# Patient Record
Sex: Female | Born: 1985
Health system: Southern US, Community
[De-identification: ages and names within clinical notes are randomized; demographics above are authoritative.]

## PROBLEM LIST (undated history)

## (undated) DIAGNOSIS — K219 Gastro-esophageal reflux disease without esophagitis: Secondary | ICD-10-CM

## (undated) DIAGNOSIS — F419 Anxiety disorder, unspecified: Secondary | ICD-10-CM

## (undated) HISTORY — DX: Anxiety disorder, unspecified: F41.9

## (undated) HISTORY — DX: Gastro-esophageal reflux disease without esophagitis: K21.9

## (undated) HISTORY — PX: DILATION AND CURETTAGE, DIAGNOSTIC / THERAPEUTIC: SUR384

---

## 2013-03-23 ENCOUNTER — Telehealth: Payer: Self-pay | Admitting: Nurse Practitioner

## 2013-03-23 NOTE — Telephone Encounter (Signed)
Spoke with patient's mother. She complains nausea, diarrhea, fever up to 101, and bodyaches. Last episode of vomiting was 11 hrs ago and she has tolerated liquids today. Advised her that she should treat the symptoms. Advil and Tylenol for fever. Once fever is down bodyaches should improve. Clear liquids as tolerated. No caffeine or dairy products. Do not use any Imodium at this time but if diarrhea persists >24 hrs she should follow-up with our office. Made her aware of Saturday clinic hours and that there is someone available by phone when the office is closed. Seek care at ED or urgent care if necessary when the office is closed.  Mother stated understanding and agreement to plan.

## 2013-06-18 ENCOUNTER — Ambulatory Visit (INDEPENDENT_AMBULATORY_CARE_PROVIDER_SITE_OTHER): Payer: Managed Care, Other (non HMO) | Admitting: General Practice

## 2013-06-18 ENCOUNTER — Encounter: Payer: Self-pay | Admitting: General Practice

## 2013-06-18 ENCOUNTER — Encounter (INDEPENDENT_AMBULATORY_CARE_PROVIDER_SITE_OTHER): Payer: Self-pay

## 2013-06-18 VITALS — BP 112/66 | HR 70 | Temp 98.5°F | Ht 66.0 in | Wt 157.2 lb

## 2013-06-18 DIAGNOSIS — F411 Generalized anxiety disorder: Secondary | ICD-10-CM

## 2013-06-18 MED ORDER — VENLAFAXINE HCL 37.5 MG PO TABS: 75.0000 mg | ORAL_TABLET | Freq: Every day | ORAL | Status: DC

## 2013-06-18 NOTE — Patient Instructions (Signed)

## 2013-06-18 NOTE — Progress Notes (Signed)
   Subjective:    Patient ID: Denise Love, female    DOB: 1985/08/22, 28 y.o.   MRN: [id number]  HPI Patient present today for follow up of anxiety. Taking effexor, 2 tablets daily. Anxiety well controlled with medication.     Review of Systems  Constitutional: Negative for fever and chills.  Respiratory: Negative for chest tightness and shortness of breath.   Cardiovascular: Negative for chest pain and palpitations.  Psychiatric/Behavioral: Negative for suicidal ideas and self-injury. The patient is not nervous/anxious.        Objective:   Physical Exam  Constitutional: She is oriented to person, place, and time. She appears well-developed and well-nourished.  Cardiovascular: Normal rate, regular rhythm and normal heart sounds.   Pulmonary/Chest: Effort normal and breath sounds normal. No respiratory distress. She exhibits no tenderness.  Abdominal: Soft. Bowel sounds are normal. She exhibits no distension. There is no tenderness.  Neurological: She is alert and oriented to person, place, and time.  Skin: Skin is warm and dry.  Psychiatric: She has a normal mood and affect.          Assessment & Plan:  1. GAD (generalized anxiety disorder) - venlafaxine (EFFEXOR) 37.5 MG tablet; Take 2 tablets (75 mg total) by mouth daily.  Dispense: 180 tablet; Refill: 1 -discussed relaxation techniques -RTO prn and schedule appointment Patient verbalized understanding Coralie KeensMae E. Drue Camera, FNP-C

## 2013-12-10 ENCOUNTER — Other Ambulatory Visit: Payer: Self-pay | Admitting: General Practice

## 2013-12-11 NOTE — Telephone Encounter (Signed)
Last ov 3/15 with Mae. ntbs

## 2013-12-11 NOTE — Telephone Encounter (Signed)
no more refills without being seen  

## 2014-01-09 ENCOUNTER — Other Ambulatory Visit: Payer: Self-pay | Admitting: Nurse Practitioner

## 2014-01-10 ENCOUNTER — Other Ambulatory Visit: Payer: Self-pay | Admitting: Nurse Practitioner

## 2014-01-10 ENCOUNTER — Telehealth: Payer: Self-pay | Admitting: Nurse Practitioner

## 2014-01-10 MED ORDER — VENLAFAXINE HCL ER 37.5 MG PO CP24: ORAL_CAPSULE | ORAL | Status: DC

## 2014-01-10 NOTE — Telephone Encounter (Signed)
Done -per mmm

## 2014-01-18 ENCOUNTER — Ambulatory Visit: Payer: Managed Care, Other (non HMO) | Admitting: Nurse Practitioner

## 2014-01-25 ENCOUNTER — Encounter (INDEPENDENT_AMBULATORY_CARE_PROVIDER_SITE_OTHER): Payer: Self-pay

## 2014-01-25 ENCOUNTER — Ambulatory Visit (INDEPENDENT_AMBULATORY_CARE_PROVIDER_SITE_OTHER): Payer: Managed Care, Other (non HMO) | Admitting: Nurse Practitioner

## 2014-01-25 ENCOUNTER — Encounter: Payer: Self-pay | Admitting: Nurse Practitioner

## 2014-01-25 VITALS — BP 116/69 | HR 76 | Temp 99.7°F | Ht 66.0 in | Wt 173.4 lb

## 2014-01-25 DIAGNOSIS — F411 Generalized anxiety disorder: Secondary | ICD-10-CM

## 2014-01-25 MED ORDER — VENLAFAXINE HCL ER 37.5 MG PO CP24: ORAL_CAPSULE | ORAL | Status: DC

## 2014-01-25 NOTE — Patient Instructions (Signed)
Stress and Stress Management Stress is a normal reaction to life events. It is what you feel when life demands more than you are used to or more than you can handle. Some stress can be useful. For example, the stress reaction can help you catch the last bus of the day, study for a test, or meet a deadline at work. But stress that occurs too often or for too long can cause problems. It can affect your emotional health and interfere with relationships and normal daily activities. Too much stress can weaken your immune system and increase your risk for physical illness. If you already have a medical problem, stress can make it worse. CAUSES  All sorts of life events may cause stress. An event that causes stress for one person may not be stressful for another person. Major life events commonly cause stress. These may be positive or negative. Examples include losing your job, moving into a new home, getting married, having a baby, or losing a loved one. Less obvious life events may also cause stress, especially if they occur day after day or in combination. Examples include working long hours, driving in traffic, caring for children, being in debt, or being in a difficult relationship. SIGNS AND SYMPTOMS Stress may cause emotional symptoms including, the following:  Anxiety. This is feeling worried, afraid, on edge, overwhelmed, or out of control.  Anger. This is feeling irritated or impatient.  Depression. This is feeling sad, down, helpless, or guilty.  Difficulty focusing, remembering, or making decisions. Stress may cause physical symptoms, including the following:   Aches and pains. These may affect your head, neck, back, stomach, or other areas of your body.  Tight muscles or clenched jaw.  Low energy or trouble sleeping. Stress may cause unhealthy behaviors, including the following:   Eating to feel better (overeating) or skipping meals.  Sleeping too little, too much, or both.  Working  too much or putting off tasks (procrastination).  Smoking, drinking alcohol, or using drugs to feel better. DIAGNOSIS  Stress is diagnosed through an assessment by your health care provider. Your health care provider will ask questions about your symptoms and any stressful life events.Your health care provider will also ask about your medical history and may order blood tests or other tests. Certain medical conditions and medicine can cause physical symptoms similar to stress. Mental illness can cause emotional symptoms and unhealthy behaviors similar to stress. Your health care provider may refer you to a mental health professional for further evaluation.  TREATMENT  Stress management is the recommended treatment for stress.The goals of stress management are reducing stressful life events and coping with stress in healthy ways.  Techniques for reducing stressful life events include the following:  Stress identification. Self-monitor for stress and identify what causes stress for you. These skills may help you to avoid some stressful events.  Time management. Set your priorities, keep a calendar of events, and learn to say "no." These tools can help you avoid making too many commitments. Techniques for coping with stress include the following:  Rethinking the problem. Try to think realistically about stressful events rather than ignoring them or overreacting. Try to find the positives in a stressful situation rather than focusing on the negatives.  Exercise. Physical exercise can release both physical and emotional tension. The key is to find a form of exercise you enjoy and do it regularly.  Relaxation techniques. These relax the body and mind. Examples include yoga, meditation, tai chi, biofeedback, deep  breathing, progressive muscle relaxation, listening to music, being out in nature, journaling, and other hobbies. Again, the key is to find one or more that you enjoy and can do  regularly.  Healthy lifestyle. Eat a balanced diet, get plenty of sleep, and do not smoke. Avoid using alcohol or drugs to relax.  Strong support network. Spend time with family, friends, or other people you enjoy being around.Express your feelings and talk things over with someone you trust. Counseling or talktherapy with a mental health professional may be helpful if you are having difficulty managing stress on your own. Medicine is typically not recommended for the treatment of stress.Talk to your health care provider if you think you need medicine for symptoms of stress. HOME CARE INSTRUCTIONS  Keep all follow-up visits as directed by your health care provider.  Take all medicines as directed by your health care provider. SEEK MEDICAL CARE IF:  Your symptoms get worse or you start having new symptoms.  You feel overwhelmed by your problems and can no longer manage them on your own. SEEK IMMEDIATE MEDICAL CARE IF:  You feel like hurting yourself or someone else. Document Released: 09/01/2000 Document Revised: 07/23/2013 Document Reviewed: 10/31/2012 Exit[employer] Patient Information 2015 [employer]. This information is not intended to replace advice given to you by your health care provider. Make sure you discuss any questions you have with your health care provider.  

## 2014-01-25 NOTE — Progress Notes (Signed)
   Subjective:    Patient ID: Denise Love, female    DOB: Apr 02, 1985, 28 y.o.   MRN: [id number]  HPI Patient here today for refill of effexor that she takes for anxiety- working well - no c/o side effects    Review of Systems  Constitutional: Negative.   Respiratory: Negative.   Cardiovascular: Negative.   Gastrointestinal: Negative.   Genitourinary: Negative.   Neurological: Negative.   Psychiatric/Behavioral: Negative.   All other systems reviewed and are negative.      Objective:   Physical Exam  Constitutional: She is oriented to person, place, and time. She appears well-developed and well-nourished. No distress.  Cardiovascular: Normal rate, regular rhythm and normal heart sounds.   Pulmonary/Chest: Effort normal and breath sounds normal.  Neurological: She is alert and oriented to person, place, and time.  Skin: Skin is warm and dry.  Psychiatric: She has a normal mood and affect. Her behavior is normal. Judgment and thought content normal.   BP 116/69 mmHg  Pulse 76  Temp(Src) 99.7 F (37.6 C) (Oral)  Ht 5\' 6"  (1.676 m)  Wt 173 lb 6.4 oz (78.654 kg)  BMI 28.00 kg/m2  LMP 12/27/2013        Assessment & Plan:   1. GAD (generalized anxiety disorder)    Meds ordered this encounter  Medications  . venlafaxine XR (EFFEXOR-XR) 37.5 MG 24 hr capsule    Sig: TAKE (2) CAPSULES DAILY    Dispense:  60 capsule    Refill:  11    Order Specific Question:  Supervising Provider    Answer:  Ernestina PennaMOORE, DONALD W [[id number]]   Stress management RTO prn Patient to make appointment for complete physical  Mary-Margaret Daphine DeutscherMartin, FNP

## 2014-09-09 ENCOUNTER — Ambulatory Visit (INDEPENDENT_AMBULATORY_CARE_PROVIDER_SITE_OTHER): Payer: 59 | Admitting: *Deleted

## 2014-09-09 DIAGNOSIS — Z111 Encounter for screening for respiratory tuberculosis: Secondary | ICD-10-CM | POA: Diagnosis not present

## 2014-09-09 NOTE — Progress Notes (Signed)
Pt given PPD skin test left forearm and tolerated well.

## 2014-09-09 NOTE — Patient Instructions (Signed)

## 2014-09-12 ENCOUNTER — Ambulatory Visit: Payer: 59 | Admitting: *Deleted

## 2014-09-12 DIAGNOSIS — Z111 Encounter for screening for respiratory tuberculosis: Secondary | ICD-10-CM

## 2014-09-12 LAB — TB SKIN TEST
INDURATION: 0 mm
TB SKIN TEST: NEGATIVE

## 2014-09-12 NOTE — Progress Notes (Signed)
Pt came in today to have TB skin test read. PPD negative, result printed and given to pt.

## 2014-11-18 ENCOUNTER — Ambulatory Visit (INDEPENDENT_AMBULATORY_CARE_PROVIDER_SITE_OTHER): Payer: 59 | Admitting: Pediatrics

## 2014-11-18 ENCOUNTER — Encounter: Payer: Self-pay | Admitting: Pediatrics

## 2014-11-18 VITALS — BP 120/74 | HR 87 | Temp 97.0°F | Ht 66.0 in | Wt 178.0 lb

## 2014-11-18 DIAGNOSIS — F411 Generalized anxiety disorder: Secondary | ICD-10-CM

## 2014-11-18 MED ORDER — VENLAFAXINE HCL ER 150 MG PO CP24: ORAL_CAPSULE | ORAL | Status: DC

## 2014-11-18 MED ORDER — VENLAFAXINE HCL ER 150 MG PO CP24: 150.0000 mg | ORAL_CAPSULE | Freq: Every day | ORAL | Status: DC

## 2014-11-18 NOTE — Patient Instructions (Addendum)
--  cut out all caffeine --try for 30-45 minutes of exercise 5 days a week, can be walking/running/swimming/biking/zumba --Venlafaxine up to  daily --cognitive behavioral therapy for anxiety

## 2014-11-18 NOTE — Progress Notes (Signed)
Subjective:    Patient ID: Denise Love, female    DOB: July 17, 1985, 29 y.o.   MRN: [id number]  HPI: Denise Love is a 29 y.o. female presenting on 11/18/2014 for Follow-up of anxiety. She has been on effexor for apprx 2 years. She felt her symptoms stabilized at  daily but recently over the last few weeks she has had worsening of symptoms, feeling panicky for no reason, sometimes to the point of needing to pull her car over on the road side until symptoms resolve. Sometimes has finger tingling with symptoms, sometimes feels hot/flushed with symptoms. She denies thoughts of self-harm. She is studying to pass boards and working a late night shift, ending at 44, has felt increased stress lately. Drinks apprx 2-3 cups of caffeinated beverage in a day.  In the past has tried Paxil, made her feel crazy.   Relevant past medical, surgical, family and social history reviewed and updated as indicated. Interim medical history since our last visit reviewed. Allergies and medications reviewed and updated.  Feeling nervous, anxious or on edge  2 Not being able to stop or control worrying  2 Worrying too much about different things  2 Trouble relaxing 2 Being so restless that it is hard to sit still  0 Becoming easily annoyed or irritable  2 Feeling afraid as if something awful might happen 0        Total Score 10  Review of Systems  Constitutional: Negative for fever, chills, weight loss and malaise/fatigue.  HENT: Negative for congestion.   Cardiovascular: Negative for palpitations.  Gastrointestinal: Negative for nausea and vomiting.  Skin: Negative for itching.  Neurological: Negative for headaches.  Psychiatric/Behavioral: Negative for depression, suicidal ideas and substance abuse. The patient is nervous/anxious.     ROS: Per HPI unless specifically indicated above   Current Outpatient Prescriptions  Medication Sig Dispense Refill  . venlafaxine XR (EFFEXOR-XR) 150 MG 24 hr capsule  TAKE (2) CAPSULES DAILY 30 capsule 1   No current facility-administered medications for this visit.       Objective:    BP 120/74 mmHg  Pulse 87  Temp(Src) 97 F (36.1 C) (Oral)  Ht  (1.676 m)  Wt 178 lb (80.74 kg)  BMI 28.74 kg/m2  Wt Readings from Last 3 Encounters:  11/18/14 178 lb (80.74 kg)  01/25/14 173 lb 6.4 oz (78.654 kg)  06/18/13 157 lb 3.2 oz (71.305 kg)     Gen: NAD, alert, cooperative with exam, NCAT EYES: EOMI, no scleral injection or icterus ENT:  TMs pearly gray b/l, OP without erythema LYMPH: no cervical LAD, normal thyroid, no nodules CV: NRRR, normal S1/S2, no murmur, DP pulses 2+ b/l Resp: CTABL, no wheezes, normal WOB Abd: +BS, soft, NTND. no guarding or organomegaly Ext: No edema, warm Neuro: Alert and oriented, strength equal b/l UE and LE, coodrination grossly normal MSK: normal muscle bulk     Assessment & Plan:   Shenique was seen today for follow-up.  Diagnoses and all orders for this visit:  GAD (generalized anxiety disorder) Check TSH today, last checked 2 years ago was normal but now with new symptoms. Will increase effexor to  daily, if no improvement or worsening of symptoms will try alternate agent.  -     TSH -     venlafaxine XR (EFFEXOR-XR) 150 MG 24 hr capsule; TAKE (1) CAPSULES DAILY   Follow up plan: Return in about 4 weeks (around 12/16/2014).  Rex Kras, MD [facility] 11/18/2014, 9:11 PM

## 2014-11-19 LAB — TSH: TSH: 1.79 u[IU]/mL (ref 0.450–4.500)

## 2014-12-17 ENCOUNTER — Encounter: Payer: Self-pay | Admitting: Pediatrics

## 2014-12-17 ENCOUNTER — Ambulatory Visit (INDEPENDENT_AMBULATORY_CARE_PROVIDER_SITE_OTHER): Payer: 59 | Admitting: Pediatrics

## 2014-12-17 VITALS — BP 115/68 | HR 86 | Temp 97.6°F | Ht 66.0 in | Wt 175.0 lb

## 2014-12-17 DIAGNOSIS — F411 Generalized anxiety disorder: Secondary | ICD-10-CM

## 2014-12-17 MED ORDER — BUPROPION HCL ER (XL) 150 MG PO TB24: 150.0000 mg | ORAL_TABLET | Freq: Every day | ORAL | Status: DC

## 2014-12-17 NOTE — Progress Notes (Signed)
    Subjective:    Patient ID: Denise Love, female    DOB: [birth year], 29 y.o.   MRN: [id number]  CC: anxiety f/u  HPI: Denise Love is a 29 y.o. female presenting on 12/17/2014 for Follow-up  Not feeling any difference with the increase in the effexor. Mostly has anxiety, mood has been down because of the anxiety. No SI/HI. Sleeping well. Appetite has been normal. Still studying for boards, coming up in 2 weeks.  Relevant past medical, surgical, family and social history reviewed and updated as indicated. Interim medical history since our last visit reviewed. Allergies and medications reviewed and updated.   ROS: Per HPI unless specifically indicated above  Past Medical History Patient Active Problem List   Diagnosis Date Noted  . GAD (generalized anxiety disorder) 01/25/2014    Current Outpatient Prescriptions  Medication Sig Dispense Refill  . venlafaxine XR (EFFEXOR-XR) 150 MG 24 hr capsule Take 1 capsule (150 mg total) by mouth daily with breakfast. 30 capsule 1  . buPROPion (WELLBUTRIN XL) 150 MG 24 hr tablet Take 1 tablet (150 mg total) by mouth daily. 30 tablet 5   No current facility-administered medications for this visit.       Objective:    BP 115/68 mmHg  Pulse 86  Temp(Src) 97.6 F (36.4 C) (Oral)  Ht  (1.676 m)  Wt 175 lb (79.379 kg)  BMI 28.26 kg/m2  Wt Readings from Last 3 Encounters:  12/17/14 175 lb (79.379 kg)  11/18/14 178 lb (80.74 kg)  01/25/14 173 lb 6.4 oz (78.654 kg)    Gen: NAD, alert, cooperative with exam, NCAT EYES: EOMI, no scleral injection or icterus CV: NRRR, normal S1/S2, no murmur, DP pulses 2+ b/l Resp: CTABL, no wheezes, normal WOB Ext: No edema, warm Neuro: strength equal b/l UE and LE, coordination grossly normal MSK: normal muscle bulk Psych:  Alert and oriented, no SI/HI. Slightly flat affect.     Assessment & Plan:   [name] was seen today for follow-up.  Diagnoses and all orders for this visit:  GAD  (generalized anxiety disorder) No improvement on increased dose of effexor. Has only been 4 weeks. Will go ahead and add buproprion, return precautions given. -     buPROPion (WELLBUTRIN XL) 150 MG 24 hr tablet; Take 1 tablet (150 mg total) by mouth daily.   Follow up plan: Return in about 8 weeks (around 02/11/2015).  Rex Kras, MD [facility] 12/17/2014, 8:54 AM

## 2015-01-07 ENCOUNTER — Encounter: Payer: Self-pay | Admitting: Pediatrics

## 2015-01-07 DIAGNOSIS — R5383 Other fatigue: Secondary | ICD-10-CM

## 2015-01-08 NOTE — Addendum Note (Signed)
Addended by: Johna SheriffVINCENT, Skarlette Lattner L on: 01/08/2015 06:13 PM   Modules accepted: Orders

## 2015-01-09 ENCOUNTER — Other Ambulatory Visit (INDEPENDENT_AMBULATORY_CARE_PROVIDER_SITE_OTHER): Payer: 59

## 2015-01-09 DIAGNOSIS — R5383 Other fatigue: Secondary | ICD-10-CM

## 2015-01-09 NOTE — Progress Notes (Signed)
Lab only 

## 2015-01-10 LAB — CBC WITH DIFFERENTIAL/PLATELET
BASOS ABS: 0 10*3/uL (ref 0.0–0.2)
BASOS: 1 %
EOS (ABSOLUTE): 0.2 10*3/uL (ref 0.0–0.4)
EOS: 3 %
HEMOGLOBIN: 13.6 g/dL (ref 11.1–15.9)
Hematocrit: 39.8 % (ref 34.0–46.6)
IMMATURE GRANS (ABS): 0 10*3/uL (ref 0.0–0.1)
IMMATURE GRANULOCYTES: 0 %
Lymphocytes Absolute: 2.2 10*3/uL (ref 0.7–3.1)
Lymphs: 36 %
MCH: 31.2 pg (ref 26.6–33.0)
MCHC: 34.2 g/dL (ref 31.5–35.7)
MCV: 91 fL (ref 79–97)
MONOCYTES: 8 %
Monocytes Absolute: 0.5 10*3/uL (ref 0.1–0.9)
Neutrophils Absolute: 3.4 10*3/uL (ref 1.4–7.0)
Neutrophils: 52 %
Platelets: 267 10*3/uL (ref 150–379)
RBC: 4.36 x10E6/uL (ref 3.77–5.28)
RDW: 12.3 % (ref 12.3–15.4)
WBC: 6.3 10*3/uL (ref 3.4–10.8)

## 2015-01-10 LAB — VITAMIN B12: VITAMIN B 12: 898 pg/mL (ref 211–946)

## 2015-01-10 LAB — FOLATE: FOLATE: 12.7 ng/mL (ref >3.0)

## 2015-01-29 ENCOUNTER — Encounter: Payer: Self-pay | Admitting: Pediatrics

## 2015-01-29 ENCOUNTER — Ambulatory Visit (INDEPENDENT_AMBULATORY_CARE_PROVIDER_SITE_OTHER): Payer: 59 | Admitting: Pediatrics

## 2015-01-29 VITALS — BP 120/79 | HR 92 | Temp 97.4°F | Ht 66.0 in | Wt 175.0 lb

## 2015-01-29 DIAGNOSIS — F411 Generalized anxiety disorder: Secondary | ICD-10-CM

## 2015-01-29 MED ORDER — BUPROPION HCL ER (XL) 300 MG PO TB24
300.0000 mg | ORAL_TABLET | Freq: Every day | ORAL | Status: DC
Start: 2015-01-29 — End: 2015-08-20

## 2015-01-29 MED ORDER — VENLAFAXINE HCL ER 150 MG PO CP24: 150.0000 mg | ORAL_CAPSULE | Freq: Every day | ORAL | Status: DC

## 2015-01-29 NOTE — Progress Notes (Signed)
    Subjective:    Patient ID: Karena AddisonLauren Sims, female    DOB: [birth year], 29 y.o.   MRN: [id number]  CC: anxiety f/u  HPI: Karena AddisonLauren Sims is a 29 y.o. female presenting on 01/29/2015 for Medication recheck  Passed nursing boards No significant change in mood Anxiety is better Has some anxious days Starting work at [employer] soon No SI/HI   Relevant past medical, surgical, family and social history reviewed and updated as indicated. Interim medical history since our last visit reviewed. Allergies and medications reviewed and updated.   ROS: Per HPI unless specifically indicated above  Past Medical History Patient Active Problem List   Diagnosis Date Noted  . GAD (generalized anxiety disorder) 01/25/2014    Current Outpatient Prescriptions  Medication Sig Dispense Refill  . buPROPion (WELLBUTRIN XL) 300 MG 24 hr tablet Take 1 tablet (300 mg total) by mouth daily. 90 tablet 1  . venlafaxine XR (EFFEXOR-XR) 150 MG 24 hr capsule Take 1 capsule (150 mg total) by mouth daily with breakfast. 90 capsule 1   No current facility-administered medications for this visit.       Objective:    BP 120/79 mmHg  Pulse 92  Temp(Src) 97.4 F (36.3 C) (Oral)  Ht 5\' 6"  (1.676 m)  Wt 175 lb (79.379 kg)  BMI 28.26 kg/m2  Wt Readings from Last 3 Encounters:  01/29/15 175 lb (79.379 kg)  12/17/14 175 lb (79.379 kg)  11/18/14 178 lb (80.74 kg)    Gen: NAD, alert, cooperative with exam, NCAT EYES: EOMI, no scleral injection or icterus ENT:   OP without erythema LYMPH: no cervical LAD CV: NRRR, normal S1/S2, no murmur, distal pulses 2+ b/l Resp: CTABL, no wheezes, normal WOB Ext: warm Neuro: Alert and oriented, strength equal b/l UE and LE, coordination grossly normal Psych: normal affect, mood is "fine"     Assessment & Plan:   Leotis ShamesLauren was seen today for medication recheck and anxiety/mood disorder. Mood stable. Anxiety symptoms come and go, better since being on the below medicines and  stopping lexapro but still has bad days. Will increase wellbutrin today. Consider switch to buspar if still symptomatic at next visit RTC for pap smear and anxiety recheck.  Diagnoses and all orders for this visit:  GAD (generalized anxiety disorder) -     buPROPion (WELLBUTRIN XL) 300 MG 24 hr tablet; Take 1 tablet (300 mg total) by mouth daily. -     venlafaxine XR (EFFEXOR-XR) 150 MG 24 hr capsule; Take 1 capsule (150 mg total) by mouth daily with breakfast.   Follow up plan: Return in about 3 months (around 05/01/2015) for pap smear.  Rex Krasarol Sonjia Wilcoxson, MD [facility] 01/29/2015, 9:05 AM

## 2015-04-30 ENCOUNTER — Other Ambulatory Visit (HOSPITAL_COMMUNITY): Payer: Self-pay | Admitting: Obstetrics & Gynecology

## 2015-04-30 DIAGNOSIS — Z3141 Encounter for fertility testing: Secondary | ICD-10-CM

## 2015-05-05 ENCOUNTER — Ambulatory Visit (HOSPITAL_COMMUNITY)
Admission: RE | Admit: 2015-05-05 | Discharge: 2015-05-05 | Disposition: A | Discharge status: Home / Self Care | Payer: PRIVATE HEALTH INSURANCE | Source: Ambulatory Visit | Attending: Obstetrics & Gynecology | Admitting: Obstetrics & Gynecology

## 2015-05-05 DIAGNOSIS — Z3141 Encounter for fertility testing: Secondary | ICD-10-CM | POA: Diagnosis not present

## 2015-05-05 DIAGNOSIS — N971 Female infertility of tubal origin: Secondary | ICD-10-CM | POA: Insufficient documentation

## 2015-05-05 MED ORDER — IOHEXOL 300 MG/ML  SOLN
30.0000 mL | Freq: Once | INTRAMUSCULAR | Status: AC | PRN
  Administered 2015-05-05: 10 mL

## 2015-06-02 ENCOUNTER — Telehealth: Payer: Self-pay | Admitting: Pediatrics

## 2015-06-09 NOTE — Telephone Encounter (Signed)
Pt reported

## 2015-08-20 ENCOUNTER — Other Ambulatory Visit: Payer: Self-pay | Admitting: Pediatrics

## 2015-08-20 NOTE — Telephone Encounter (Signed)
Authorize 30 days only. Then contact the patient letting them know that they will need an appointment before any further prescriptions can be sent in. 

## 2015-08-20 NOTE — Telephone Encounter (Signed)
Last seen 01/29/15  Dr Oswaldo DoneVincent

## 2015-08-20 NOTE — Telephone Encounter (Signed)
Patient aware, will need to schedule a follow up appointment in a month before getting more refills.

## 2015-11-27 ENCOUNTER — Ambulatory Visit (INDEPENDENT_AMBULATORY_CARE_PROVIDER_SITE_OTHER): Payer: 59 | Admitting: Pediatrics

## 2015-11-27 ENCOUNTER — Encounter: Payer: Self-pay | Admitting: Pediatrics

## 2015-11-27 VITALS — BP 114/73 | HR 92 | Temp 98.9°F | Ht 66.0 in | Wt 169.4 lb

## 2015-11-27 DIAGNOSIS — F411 Generalized anxiety disorder: Secondary | ICD-10-CM | POA: Diagnosis not present

## 2015-11-27 MED ORDER — BUPROPION HCL ER (XL) 300 MG PO TB24: 300.0000 mg | ORAL_TABLET | Freq: Every day | ORAL | 3 refills | Status: DC

## 2015-11-27 MED ORDER — VENLAFAXINE HCL ER 150 MG PO CP24: ORAL_CAPSULE | ORAL | 3 refills | Status: DC

## 2015-11-27 NOTE — Progress Notes (Signed)
  Subjective:   Patient ID: Denise Love, female    DOB: 1986/02/14, 30 y.o.   MRN: [id number] CC: Medication Refill  HPI: Denise Love is a 30 y.o. female presenting for Medication Refill  On wellbutrin/effexor for anxiety and mood problems Thinks medications working well Going through IVF now Trisomy 13 with first fetus, had D and C at 8 weeks within last few months Mood has been fairly upbeat Pt says she has lots of support now Feels safe at home She says OB is aware of medications she is on, they decided to keep her on the medications for now Will let me know if needs to change medicines  Depression screen [facility] 11/27/2015 12/17/2014 11/18/2014 01/25/2014  Decreased Interest 0 0 0 0  Down, Depressed, Hopeless 0 0 0 0  PHQ - 2 Score 0 0 0 0  Altered sleeping - 1 - -  Tired, decreased energy - 1 - -  Change in appetite - 0 - -  Feeling bad or failure about yourself  - 0 - -  Trouble concentrating - 1 - -  Moving slowly or fidgety/restless - 1 - -  Suicidal thoughts - 0 - -  PHQ-9 Score - 4 - -    Relevant past medical, surgical, family and social history reviewed. Allergies and medications reviewed and updated. History  Smoking Status  . Former Smoker  . Packs/day: 0.25  . Types: Cigarettes  Smokeless Tobacco  . Never Used   ROS: Per HPI   Objective:    BP 114/73   Pulse 92   Temp 98.9 F (37.2 C) (Oral)   Ht 5\' 6"  (1.676 m)   Wt 169 lb 6.4 oz (76.8 kg)   BMI 27.34 kg/m   Wt Readings from Last 3 Encounters:  11/27/15 169 lb 6.4 oz (76.8 kg)  01/29/15 175 lb (79.4 kg)  12/17/14 175 lb (79.4 kg)    Gen: NAD, alert, cooperative with exam, NCAT EYES: EOMI, no conjunctival injection, or no icterus ENT:  OP without erythema LYMPH: no cervical LAD CV: NRRR, normal S1/S2, no murmur, distal pulses 2+ b/l Resp: CTABL, no wheezes, normal WOB Ext: No edema, warm Neuro: Alert and oriented, strength equal b/l UE and LE, coordination grossly normal MSK: normal muscle  bulk Psych: affect full  Assessment & Plan:  Denise Love was seen today for medication refill.  Diagnoses and all orders for this visit:  GAD (generalized anxiety disorder) -     buPROPion (WELLBUTRIN XL) 300 MG 24 hr tablet; Take 1 tablet (300 mg total) by mouth daily. -     venlafaxine XR (EFFEXOR-XR) 150 MG 24 hr capsule; TAKE 1 CAPSULE DAILY WITH BREAKFAST   Follow up plan: One yr, sooner if needed Rex Krasarol Tykia Mellone, MD [facility]

## 2016-03-22 NOTE — L&D Delivery Note (Signed)
Delivery Note At 10:44 PM a viable female was delivered via NSVD  APGAR: 9 9  weight pending .   Placenta status:spontaneous with 3 vessel cord , .  Cord:  with the following complications:none .  Cord pH: not obtained  Anesthesia: epidural  Episiotomy:  none Lacerations: first  Suture Repair: 3.0 chromic Est. Blood Loss (mL):  300  Mom to postpartum.  Baby to Couplet care / Skin to Skin.  Theodis Kinsel L 09/28/2016, 11:03 PM

## 2016-04-14 LAB — OB RESULTS CONSOLE ABO/RH: RH TYPE: POSITIVE

## 2016-04-14 LAB — OB RESULTS CONSOLE RUBELLA ANTIBODY, IGM: RUBELLA: IMMUNE

## 2016-04-14 LAB — OB RESULTS CONSOLE HIV ANTIBODY (ROUTINE TESTING): HIV: NONREACTIVE

## 2016-04-14 LAB — OB RESULTS CONSOLE GC/CHLAMYDIA
Chlamydia: NEGATIVE
Gonorrhea: NEGATIVE

## 2016-04-14 LAB — OB RESULTS CONSOLE HEPATITIS B SURFACE ANTIGEN: HEP B S AG: NEGATIVE

## 2016-04-14 LAB — OB RESULTS CONSOLE RPR: RPR: NONREACTIVE

## 2016-04-14 LAB — OB RESULTS CONSOLE ANTIBODY SCREEN: Antibody Screen: NEGATIVE

## 2016-05-17 DIAGNOSIS — E7800 Pure hypercholesterolemia, unspecified: Secondary | ICD-10-CM | POA: Diagnosis not present

## 2016-05-17 DIAGNOSIS — Z713 Dietary counseling and surveillance: Secondary | ICD-10-CM | POA: Diagnosis not present

## 2016-05-17 DIAGNOSIS — Z136 Encounter for screening for cardiovascular disorders: Secondary | ICD-10-CM | POA: Diagnosis not present

## 2016-07-22 DIAGNOSIS — Z23 Encounter for immunization: Secondary | ICD-10-CM | POA: Diagnosis not present

## 2016-09-28 ENCOUNTER — Inpatient Hospital Stay (HOSPITAL_COMMUNITY): Payer: 59 | Admitting: Anesthesiology

## 2016-09-28 ENCOUNTER — Inpatient Hospital Stay (HOSPITAL_COMMUNITY)
Admission: AD | Admit: 2016-09-28 | Discharge: 2016-09-30 | DRG: 775 | Disposition: A | Discharge status: Home / Self Care | Payer: 59 | Source: Ambulatory Visit | Attending: Obstetrics and Gynecology | Admitting: Obstetrics and Gynecology

## 2016-09-28 ENCOUNTER — Encounter (HOSPITAL_COMMUNITY): Payer: Self-pay

## 2016-09-28 DIAGNOSIS — Z6832 Body mass index (BMI) 32.0-32.9, adult: Secondary | ICD-10-CM | POA: Diagnosis not present

## 2016-09-28 DIAGNOSIS — O9962 Diseases of the digestive system complicating childbirth: Secondary | ICD-10-CM | POA: Diagnosis not present

## 2016-09-28 DIAGNOSIS — Z3A36 36 weeks gestation of pregnancy: Secondary | ICD-10-CM

## 2016-09-28 DIAGNOSIS — E669 Obesity, unspecified: Secondary | ICD-10-CM | POA: Diagnosis present

## 2016-09-28 DIAGNOSIS — Z87891 Personal history of nicotine dependence: Secondary | ICD-10-CM | POA: Diagnosis not present

## 2016-09-28 DIAGNOSIS — O99214 Obesity complicating childbirth: Secondary | ICD-10-CM | POA: Diagnosis present

## 2016-09-28 DIAGNOSIS — K219 Gastro-esophageal reflux disease without esophagitis: Secondary | ICD-10-CM | POA: Diagnosis present

## 2016-09-28 LAB — CBC
HEMATOCRIT: 41.8 % (ref 36.0–46.0)
Hemoglobin: 14.7 g/dL (ref 12.0–15.0)
MCH: 32.5 pg (ref 26.0–34.0)
MCHC: 35.2 g/dL (ref 30.0–36.0)
MCV: 92.5 fL (ref 78.0–100.0)
Platelets: 201 10*3/uL (ref 150–400)
RBC: 4.52 MIL/uL (ref 3.87–5.11)
RDW: 13.5 % (ref 11.5–15.5)
WBC: 17.2 10*3/uL — AB (ref 4.0–10.5)

## 2016-09-28 MED ORDER — ONDANSETRON HCL 4 MG/2ML IJ SOLN
4.0000 mg | Freq: Four times a day (QID) | INTRAMUSCULAR | Status: DC | PRN
  Administered 2016-09-28: 4 mg via INTRAVENOUS
  Filled 2016-09-28: qty 2

## 2016-09-28 MED ORDER — OXYTOCIN BOLUS FROM INFUSION
500.0000 mL | Freq: Once | INTRAVENOUS | Status: AC
  Administered 2016-09-28: 500 mL via INTRAVENOUS

## 2016-09-28 MED ORDER — LACTATED RINGERS IV SOLN
INTRAVENOUS | Status: DC
  Administered 2016-09-28: 20:00:00 via INTRAVENOUS

## 2016-09-28 MED ORDER — PHENYLEPHRINE 40 MCG/ML (10ML) SYRINGE FOR IV PUSH (FOR BLOOD PRESSURE SUPPORT)
80.0000 ug | PREFILLED_SYRINGE | INTRAVENOUS | Status: DC | PRN
  Filled 2016-09-28: qty 5

## 2016-09-28 MED ORDER — LACTATED RINGERS IV SOLN
500.0000 mL | Freq: Once | INTRAVENOUS | Status: AC
  Administered 2016-09-28: 500 mL via INTRAVENOUS

## 2016-09-28 MED ORDER — LIDOCAINE HCL (PF) 1 % IJ SOLN
INTRAMUSCULAR | Status: DC | PRN
  Administered 2016-09-28 (×2): 4 mL via EPIDURAL

## 2016-09-28 MED ORDER — SODIUM CHLORIDE 0.9 % IV SOLN
2.0000 g | Freq: Once | INTRAVENOUS | Status: AC
  Administered 2016-09-28: 2 g via INTRAVENOUS
  Filled 2016-09-28: qty 2000

## 2016-09-28 MED ORDER — OXYCODONE-ACETAMINOPHEN 5-325 MG PO TABS: 1.0000 | ORAL_TABLET | ORAL | Status: DC | PRN

## 2016-09-28 MED ORDER — PHENYLEPHRINE 40 MCG/ML (10ML) SYRINGE FOR IV PUSH (FOR BLOOD PRESSURE SUPPORT)
80.0000 ug | PREFILLED_SYRINGE | INTRAVENOUS | Status: DC | PRN
  Filled 2016-09-28: qty 10
  Filled 2016-09-28: qty 5
  Filled 2016-09-28: qty 10

## 2016-09-28 MED ORDER — EPHEDRINE 5 MG/ML INJ
10.0000 mg | INTRAVENOUS | Status: DC | PRN
  Filled 2016-09-28: qty 2

## 2016-09-28 MED ORDER — SOD CITRATE-CITRIC ACID 500-334 MG/5ML PO SOLN: 30.0000 mL | ORAL | Status: DC | PRN

## 2016-09-28 MED ORDER — OXYTOCIN 40 UNITS IN LACTATED RINGERS INFUSION - SIMPLE MED
2.5000 [IU]/h | INTRAVENOUS | Status: DC
  Administered 2016-09-28: 2.5 [IU]/h via INTRAVENOUS
  Filled 2016-09-28: qty 1000

## 2016-09-28 MED ORDER — OXYCODONE-ACETAMINOPHEN 5-325 MG PO TABS: 2.0000 | ORAL_TABLET | ORAL | Status: DC | PRN

## 2016-09-28 MED ORDER — DIPHENHYDRAMINE HCL 50 MG/ML IJ SOLN: 12.5000 mg | INTRAMUSCULAR | Status: DC | PRN

## 2016-09-28 MED ORDER — LIDOCAINE HCL (PF) 1 % IJ SOLN
30.0000 mL | INTRAMUSCULAR | Status: DC | PRN
  Filled 2016-09-28: qty 30

## 2016-09-28 MED ORDER — ACETAMINOPHEN 325 MG PO TABS
650.0000 mg | ORAL_TABLET | ORAL | Status: DC | PRN
  Filled 2016-09-28: qty 2

## 2016-09-28 MED ORDER — LACTATED RINGERS IV SOLN: 500.0000 mL | INTRAVENOUS | Status: DC | PRN

## 2016-09-28 MED ORDER — FENTANYL 2.5 MCG/ML BUPIVACAINE 1/10 % EPIDURAL INFUSION (WH - ANES)
14.0000 mL/h | INTRAMUSCULAR | Status: DC | PRN
  Administered 2016-09-28 (×2): 14 mL/h via EPIDURAL
  Filled 2016-09-28 (×2): qty 100

## 2016-09-28 NOTE — Progress Notes (Addendum)
G1 @ 36.[redacted] wksga. Presents to triage for r/o labor. States ctx started 06. Denies LOF or bleeding. + FM  EFM applied   1444: Provider at bs. SVE 1/80/0  1543: cervical recheck 1.5/80-90/0. Provider made aware. Pt given option to go home or walk. Want to walk for 2 hrs.   1550: Pt assisted up. Robe given. Up walking. Knows to return @ 1745.   1730: SVE 3.5/80-90/0  1741: EFM applied.    1804: provider at bs assessing. SVE: 4/100/0 SROM clear. Charge nurse notified and [facility].  1820: Labs and IV done  1830: pt to birthing suite room [id number] via wheel chair with family.

## 2016-09-28 NOTE — Anesthesia Procedure Notes (Signed)
Epidural Patient location during procedure: OB Start time: 09/28/2016 7:15 PM  Staffing Anesthesiologist: Mal AmabileFOSTER, Jazzmyne Rasnick Performed: anesthesiologist   Preanesthetic Checklist Completed: patient identified, site marked, surgical consent, pre-op evaluation, timeout performed, IV checked, risks and benefits discussed and monitors and equipment checked  Epidural Patient position: sitting Prep: site prepped and draped and DuraPrep Patient monitoring: continuous pulse ox and blood pressure Approach: midline Location: L3-L4 Injection technique: LOR air  Needle:  Needle type: Tuohy  Needle gauge: 17 G Needle length: 9 cm and 9 Needle insertion depth: 5 cm cm Catheter type: closed end flexible Catheter size: 19 Gauge Catheter at skin depth: 10 cm Test dose: negative and Other  Assessment Events: blood not aspirated, injection not painful, no injection resistance, negative IV test and no paresthesia  Additional Notes Patient identified. Risks and benefits discussed including failed block, incomplete  Pain control, post dural puncture headache, nerve damage, paralysis, blood pressure Changes, nausea, vomiting, reactions to medications-both toxic and allergic and post Partum back pain. All questions were answered. Patient expressed understanding and wished to proceed. Sterile technique was used throughout procedure. Epidural site was Dressed with sterile barrier dressing. No paresthesias, signs of intravascular injection Or signs of intrathecal spread were encountered.  Patient was more comfortable after the epidural was dosed. Please see RN's note for documentation of vital signs and FHR which are stable.

## 2016-09-28 NOTE — MAU Note (Signed)
Urine sent to lab 

## 2016-09-28 NOTE — Anesthesia Preprocedure Evaluation (Signed)
Anesthesia Evaluation  Patient identified by MRN, date of birth, ID band Patient awake    Reviewed: Allergy & Precautions, Patient's Chart, lab work & pertinent test results  Airway Mallampati: II  TM Distance: >3 FB Neck ROM: Full    Dental no notable dental hx. (+) Teeth Intact   Pulmonary former smoker,    Pulmonary exam normal breath sounds clear to auscultation       Cardiovascular negative cardio ROS Normal cardiovascular exam Rhythm:Regular Rate:Normal     Neuro/Psych PSYCHIATRIC DISORDERS Anxiety negative neurological ROS     GI/Hepatic GERD  Medicated and Controlled,  Endo/Other  Obesity  Renal/GU negative Renal ROS  negative genitourinary   Musculoskeletal negative musculoskeletal ROS (+)   Abdominal (+) + obese,   Peds  Hematology negative hematology ROS (+)   Anesthesia Other Findings   Reproductive/Obstetrics (+) Pregnancy PROM 36 2/7 weeks                             Anesthesia Physical Anesthesia Plan  ASA: II  Anesthesia Plan: Epidural   Post-op Pain Management:    Induction:   PONV Risk Score and Plan:   Airway Management Planned: Natural Airway  Additional Equipment:   Intra-op Plan:   Post-operative Plan:   Informed Consent: I have reviewed the patients History and Physical, chart, labs and discussed the procedure including the risks, benefits and alternatives for the proposed anesthesia with the patient or authorized representative who has indicated his/her understanding and acceptance.     Plan Discussed with: Anesthesiologist  Anesthesia Plan Comments:         Anesthesia Quick Evaluation

## 2016-09-28 NOTE — H&P (Signed)
Ova FreshwaterLauren Love is a 31 y.o. G 2 P 0 at 36 w 3 days presents in active labor. SROM in MAU clear fluid. OB History    Gravida Para Term Preterm AB Living   2             SAB TAB Ectopic Multiple Live Births                 Past Medical History:  Diagnosis Date  . Anxiety   . GERD (gastroesophageal reflux disease)    Past Surgical History:  Procedure Laterality Date  . DILATION AND CURETTAGE, DIAGNOSTIC / THERAPEUTIC     Family History: family history includes Cancer in her maternal grandmother. Social History:  reports that she has quit smoking. Her smoking use included Cigarettes. She smoked 0.25 packs per day. She has never used smokeless tobacco. She reports that she drinks about 3.6 oz of alcohol per week . She reports that she does not use drugs.     Maternal Diabetes: No Genetic Screening: Normal Maternal Ultrasounds/Referrals: Normal Fetal Ultrasounds or other Referrals:  None Maternal Substance Abuse:  No Significant Maternal Medications:  None Significant Maternal Lab Results:  None Other Comments:  None  Review of Systems  All other systems reviewed and are negative.  Maternal Medical History:  Reason for admission: Contractions.     Dilation: 3.5 Effacement (%): 80 Station: 0 Exam by:: Naomie DeanJaneen McClellan, RN There were no vitals taken for this visit. Maternal Exam:  Abdomen: Fetal presentation: vertex     Fetal Exam Fetal State Assessment: Category I - tracings are normal.     Physical Exam  Nursing note and vitals reviewed. Constitutional: She appears well-developed and well-nourished.  HENT:  Head: Normocephalic.  Eyes: Pupils are equal, round, and reactive to light.  Neck: Normal range of motion.  Cardiovascular: Normal rate and regular rhythm.     Prenatal labs: ABO, Rh:   Antibody:   Rubella:   RPR:    HBsAg:    HIV:    GBS:     Assessment/Plan: IUP at 36 w 3 days Labor SROM Unknown  GBBS Admit Epidural ampicillin   Mckaylie Vasey L 09/28/2016, 6:06 PM

## 2016-09-29 LAB — CBC
HEMATOCRIT: 33.5 % — AB (ref 36.0–46.0)
HEMOGLOBIN: 11.7 g/dL — AB (ref 12.0–15.0)
MCH: 32.1 pg (ref 26.0–34.0)
MCHC: 34.9 g/dL (ref 30.0–36.0)
MCV: 91.8 fL (ref 78.0–100.0)
Platelets: 183 10*3/uL (ref 150–400)
RBC: 3.65 MIL/uL — ABNORMAL LOW (ref 3.87–5.11)
RDW: 13.5 % (ref 11.5–15.5)
WBC: 26.6 10*3/uL — AB (ref 4.0–10.5)

## 2016-09-29 LAB — RUBELLA SCREEN

## 2016-09-29 LAB — RPR: RPR: NONREACTIVE

## 2016-09-29 MED ORDER — SIMETHICONE 80 MG PO CHEW: 80.0000 mg | CHEWABLE_TABLET | ORAL | Status: DC | PRN

## 2016-09-29 MED ORDER — BUPROPION HCL ER (XL) 300 MG PO TB24
300.0000 mg | ORAL_TABLET | Freq: Every day | ORAL | Status: DC
  Administered 2016-09-30: 300 mg via ORAL
  Filled 2016-09-29 (×3): qty 1

## 2016-09-29 MED ORDER — IBUPROFEN 600 MG PO TABS
600.0000 mg | ORAL_TABLET | Freq: Four times a day (QID) | ORAL | Status: DC
  Administered 2016-09-29 – 2016-09-30 (×7): 600 mg via ORAL
  Filled 2016-09-29 (×6): qty 1

## 2016-09-29 MED ORDER — ONDANSETRON HCL 4 MG/2ML IJ SOLN: 4.0000 mg | INTRAMUSCULAR | Status: DC | PRN

## 2016-09-29 MED ORDER — ONDANSETRON HCL 4 MG PO TABS: 4.0000 mg | ORAL_TABLET | ORAL | Status: DC | PRN

## 2016-09-29 MED ORDER — VENLAFAXINE HCL ER 150 MG PO CP24
150.0000 mg | ORAL_CAPSULE | Freq: Every day | ORAL | Status: DC
  Administered 2016-09-30: 150 mg via ORAL
  Filled 2016-09-29 (×3): qty 1

## 2016-09-29 MED ORDER — FLEET ENEMA 7-19 GM/118ML RE ENEM: 1.0000 | ENEMA | Freq: Every day | RECTAL | Status: DC | PRN

## 2016-09-29 MED ORDER — DIPHENHYDRAMINE HCL 25 MG PO CAPS: 25.0000 mg | ORAL_CAPSULE | Freq: Four times a day (QID) | ORAL | Status: DC | PRN

## 2016-09-29 MED ORDER — WITCH HAZEL-GLYCERIN EX PADS: 1.0000 "application " | MEDICATED_PAD | CUTANEOUS | Status: DC | PRN

## 2016-09-29 MED ORDER — COCONUT OIL OIL: 1.0000 "application " | TOPICAL_OIL | Status: DC | PRN

## 2016-09-29 MED ORDER — BISACODYL 10 MG RE SUPP: 10.0000 mg | Freq: Every day | RECTAL | Status: DC | PRN

## 2016-09-29 MED ORDER — ZOLPIDEM TARTRATE 5 MG PO TABS: 5.0000 mg | ORAL_TABLET | Freq: Every evening | ORAL | Status: DC | PRN

## 2016-09-29 MED ORDER — SENNOSIDES-DOCUSATE SODIUM 8.6-50 MG PO TABS
2.0000 | ORAL_TABLET | ORAL | Status: DC
  Administered 2016-09-29 (×2): 2 via ORAL
  Filled 2016-09-29: qty 2

## 2016-09-29 MED ORDER — TETANUS-DIPHTH-ACELL PERTUSSIS 5-2.5-18.5 LF-MCG/0.5 IM SUSP
0.5000 mL | Freq: Once | INTRAMUSCULAR | Status: AC
  Administered 2016-09-30: 0.5 mL via INTRAMUSCULAR

## 2016-09-29 MED ORDER — MEASLES, MUMPS & RUBELLA VAC ~~LOC~~ INJ
0.5000 mL | INJECTION | Freq: Once | SUBCUTANEOUS | Status: DC
  Filled 2016-09-29: qty 0.5

## 2016-09-29 MED ORDER — BENZOCAINE-MENTHOL 20-0.5 % EX AERO
1.0000 "application " | INHALATION_SPRAY | CUTANEOUS | Status: DC | PRN
  Administered 2016-09-29: 1 via TOPICAL
  Filled 2016-09-29: qty 56

## 2016-09-29 MED ORDER — MEDROXYPROGESTERONE ACETATE 150 MG/ML IM SUSP: 150.0000 mg | INTRAMUSCULAR | Status: DC | PRN

## 2016-09-29 MED ORDER — PRENATAL MULTIVITAMIN CH
1.0000 | ORAL_TABLET | Freq: Every day | ORAL | Status: DC
  Administered 2016-09-29 – 2016-09-30 (×2): 1 via ORAL
  Filled 2016-09-29 (×2): qty 1

## 2016-09-29 MED ORDER — DIBUCAINE 1 % RE OINT: 1.0000 "application " | TOPICAL_OINTMENT | RECTAL | Status: DC | PRN

## 2016-09-29 MED ORDER — ACETAMINOPHEN 325 MG PO TABS
650.0000 mg | ORAL_TABLET | ORAL | Status: DC | PRN
  Administered 2016-09-29: 650 mg via ORAL

## 2016-09-29 NOTE — Progress Notes (Signed)
MOB was referred for history of depression/anxiety. * Referral screened out by Clinical Social Worker because none of the following criteria appear to apply: ~ History of anxiety/depression during this pregnancy, or of post-partum depression. ~ Diagnosis of anxiety and/or depression within last 3 years OR * MOB's symptoms currently being treated with medication and/or therapy. MOB is currently prescribed Wellbutrin.   Please contact the Clinical Social Worker if needs arise, or if MOB requests.  Blaine HamperAngel Boyd-Gilyard, MSW, LCSW Clinical Social Work [phone]

## 2016-09-29 NOTE — Anesthesia Postprocedure Evaluation (Signed)
Anesthesia Post Note  Patient: Ova FreshwaterLauren Mathew  Procedure(s) Performed: * No procedures listed *     Patient location during evaluation: Mother Baby Anesthesia Type: Epidural Level of consciousness: awake, awake and alert, oriented and patient cooperative Pain management: pain level controlled Vital Signs Assessment: post-procedure vital signs reviewed and stable Respiratory status: spontaneous breathing, nonlabored ventilation and respiratory function stable Cardiovascular status: stable Postop Assessment: no headache, no backache, no signs of nausea or vomiting and patient able to bend at knees Anesthetic complications: no    Last Vitals:  Vitals:   09/29/16 0140 09/29/16 0607  BP: 126/79 124/81  Pulse: (!) 115 96  Resp: 18 20  Temp: 37 C 36.7 C    Last Pain:  Vitals:   09/29/16 0607  TempSrc: Oral  PainSc: 3    Pain Goal: Patients Stated Pain Goal: 4 (09/28/16 1842)               Rilynne Lonsway L

## 2016-09-29 NOTE — Progress Notes (Signed)
Post Partum Day 1 Subjective: no complaints, up ad lib and tolerating PO  Objective: Blood pressure 124/81, pulse 96, temperature 98.1 F (36.7 C), temperature source Oral, resp. rate 20, height 5\' 6"  (1.676 m), weight 199 lb (90.3 kg), SpO2 100 %, unknown if currently breastfeeding.  Physical Exam:  General: alert and cooperative Lochia: appropriate Uterine Fundus: firm Incision: n/a DVT Evaluation: No evidence of DVT seen on physical exam.   Recent Labs  09/28/16 1820 09/29/16 0250  HGB 14.7 11.7*  HCT 41.8 33.5*    Assessment/Plan: Continue current care Desires circ   LOS: 1 day   Janard Culp 09/29/2016, 8:06 AM

## 2016-09-29 NOTE — Lactation Note (Signed)
This note was copied from a baby's chart. Lactation Consultation Note: Infant is 36.3 weeks and is 12 hours old . He has had several attempts to breastfeed. Mother reports that infant latched and takes a few sucks and then comes off. Mother was sat up with a DEBP by staff member. Mother has pumped 2 times. She was taught to hand express. Mother has hand expressed 7 and 4 ml . Infant was spoon fed.  LPI guidelines given to parents and reviewed supplemental guidelines.  Advised mother not to spend more than 30 mins attempting to breastfeed infant. Mother to supplement infant according to supplemental guidelines on LPI sheet. Mother to post pump every 2-3 hours. Mother to offer Alimentum if unable to hand express or pump enough milk. Mother receptive to all teaching.  Patient Name: Denise Love ZOXWR'UToday's Date: 09/29/2016 Reason for consult: Follow-up assessment   Maternal Data    Feeding Feeding Type: Breast Fed Length of feed: 5 min (few sucks)  LATCH Score/Interventions Latch: Repeated attempts needed to sustain latch, nipple held in mouth throughout feeding, stimulation needed to elicit sucking reflex. Intervention(s): Adjust position;Assist with latch;Breast massage;Breast compression  Audible Swallowing: A few with stimulation Intervention(s): Skin to skin;Hand expression Intervention(s): Alternate breast massage  Type of Nipple: Everted at rest and after stimulation  Comfort (Breast/Nipple): Soft / non-tender     Hold (Positioning): Assistance needed to correctly position infant at breast and maintain latch. Intervention(s): Breastfeeding basics reviewed;Support Pillows;Position options;Skin to skin  LATCH Score: 7  Lactation Tools Discussed/Used     Consult Status Consult Status: Follow-up Date: 09/29/16 Follow-up type: In-patient    Stevan BornKendrick, Meranda Dechaine [facility] 09/29/2016, 11:25 AM

## 2016-09-30 LAB — BIRTH TISSUE RECOVERY COLLECTION (PLACENTA DONATION)

## 2016-09-30 MED ORDER — IBUPROFEN 600 MG PO TABS: 600.0000 mg | ORAL_TABLET | Freq: Four times a day (QID) | ORAL | 0 refills | Status: DC | PRN

## 2016-09-30 MED ORDER — ACETAMINOPHEN 325 MG PO TABS: 650.0000 mg | ORAL_TABLET | ORAL | 0 refills | Status: DC | PRN

## 2016-09-30 NOTE — Lactation Note (Signed)
This note was copied from a baby's chart. Lactation Consultation Note  Patient Name: Denise Ova FreshwaterLauren Lebleu ZOXWR'UToday's Date: 09/30/2016 Reason for consult: Follow-up assessment;Late preterm infant  Baby is 6441 hours old, and has been sleepy from the circ this am.  Mom recently fed 28 ml of formula and 0.5 ml of EBM , baby tolerated well.  LC reviewed potential feeding behaviors due to being a LPT infant.  LC mentioned to mom to attempt at the breast , not more than 10 mins and if  Not latching , try and appetizer, and then latch, if not latching finish feeding with  bottle and post pump 15 -20 mins. Save milk and feed back to baby.  DEBP is set up already and mom has also pumped recently.      Maternal Data Does the patient have breastfeeding experience prior to this delivery?: No  Feeding Feeding Type: Bottle Fed - Formula Nipple Type: Slow - flow  LATCH Score/Interventions                Intervention(s): Breastfeeding basics reviewed     Lactation Tools Discussed/Used     Consult Status Consult Status: Follow-up (enc to page with feeding cues ) Date: 10/01/16 Follow-up type: In-patient    Matilde SprangMargaret Ann Dontavia Brand 09/30/2016, 4:06 PM

## 2016-09-30 NOTE — Discharge Summary (Signed)
Obstetric Discharge Summary Reason for Admission: onset of labor Prenatal Procedures: none Intrapartum Procedures: spontaneous vaginal delivery Postpartum Procedures: none Complications-Operative and Postpartum: none Hemoglobin  Date Value Ref Range Status  09/29/2016 11.7 (L) 12.0 - 15.0 g/dL Final    Comment:    DELTA CHECK NOTED REPEATED TO VERIFY   01/09/2015 13.6 11.1 - 15.9 g/dL Final   HCT  Date Value Ref Range Status  09/29/2016 33.5 (L) 36.0 - 46.0 % Final   Hematocrit  Date Value Ref Range Status  01/09/2015 39.8 34.0 - 46.6 % Final    Physical Exam:  General: alert, cooperative and no distress Lochia: appropriate Uterine Fundus: firm Incision: healing well DVT Evaluation: No evidence of DVT seen on physical exam.  Discharge Diagnoses: Preterm labor delivered  Discharge Information: Date: 09/30/2016 Activity: pelvic rest Diet: routine Medications: PNV and Ibuprofen Condition: stable Instructions: refer to practice specific booklet Discharge to: home   Newborn Data: Live born female  Birth Weight: 6 lb 7 oz (2920 g) APGAR: 9, 9  Home with mother.  Denise Love,Denise Love 09/30/2016, 10:16 PM

## 2016-10-01 ENCOUNTER — Ambulatory Visit: Payer: Self-pay

## 2016-10-01 NOTE — Lactation Note (Signed)
This note was copied from a baby's chart. Lactation Consultation Note  Patient Name: Denise Love ZOXWR'UToday's Date: 10/01/2016 Reason for consult: Follow-up assessment;Infant weight loss;Late preterm infant (2% weight loss )  Baby is 5260 hours old,  And has been increasing with bottle feeding ( EBM and formula )  Mom and dad aware the volume needs to gradually increase and by the end of 7 days the baby should take at least 45 ml a feeding or greater.  Sore nipple and engorgement prevention and tx reviewed. Per mom has  DEBP at home.  Per mom has pumped 3 times since 12MN , and the last pumping was 5 ml.  LC encouraged mom to hand express and pump, also to consider power pumping once a day ( 10 mins on / 10 mins off over 60 mins , or 20 mins on , 10 min off , 20 mins on 10 mins off over 60 mins.) once a day . Mom receptive.  [facility] Thursday 7/19 at 10:00 , appt. Reminder given to mom with directions.  Mother informed of post-discharge support and given phone number to the lactation department, including services for phone call assistance; out-patient appointments; and breastfeeding support group. List of other breastfeeding resources in the community given in the handout. Encouraged mother to call for problems or concerns related to breastfeeding.    Maternal Data    Feeding Feeding Type: Bottle Fed - Formula Nipple Type: Slow - flow  LATCH Score/Interventions                Intervention(s): Breastfeeding basics reviewed     Lactation Tools Discussed/Used Tools: Pump (per mom small amount ) Breast pump type: Double-Electric Breast Pump WIC Program: No Pump Review: Milk Storage   Consult Status Consult Status: Follow-up Date: 10/07/16 (@ 10:00 am, 10/07/2016) Follow-up type: Out-patient    Denise Love 10/01/2016, 11:11 AM

## 2016-10-07 ENCOUNTER — Ambulatory Visit: Payer: Self-pay

## 2016-10-07 NOTE — Lactation Note (Signed)
This note was copied from a baby's chart.  error

## 2017-01-07 ENCOUNTER — Other Ambulatory Visit: Payer: Self-pay | Admitting: Pediatrics

## 2017-01-07 DIAGNOSIS — F411 Generalized anxiety disorder: Secondary | ICD-10-CM

## 2017-01-11 ENCOUNTER — Other Ambulatory Visit: Payer: Self-pay | Admitting: Pediatrics

## 2017-01-11 DIAGNOSIS — F411 Generalized anxiety disorder: Secondary | ICD-10-CM

## 2017-01-11 MED ORDER — VENLAFAXINE HCL ER 150 MG PO CP24: ORAL_CAPSULE | ORAL | 0 refills | Status: DC

## 2017-01-11 NOTE — Telephone Encounter (Signed)
Last seen 11/27/15, has appointment end of October, please advise

## 2017-01-12 ENCOUNTER — Other Ambulatory Visit: Payer: Self-pay | Admitting: *Deleted

## 2017-01-12 DIAGNOSIS — F411 Generalized anxiety disorder: Secondary | ICD-10-CM

## 2017-01-12 MED ORDER — VENLAFAXINE HCL ER 150 MG PO CP24: ORAL_CAPSULE | ORAL | 0 refills | Status: DC

## 2017-01-12 NOTE — Telephone Encounter (Signed)
Left detailed message on pt voicemail stating 30 day supply of requested rx sent in and will get further refills at her appt.

## 2017-01-12 NOTE — Telephone Encounter (Signed)
[facility], patients insurance only covers for 90 day supply. Pt had not been seen since 11/27/15. Appt has been made for 01/19/17. Refill for 90 day supply has been sent to pharmacy

## 2017-01-19 ENCOUNTER — Ambulatory Visit (INDEPENDENT_AMBULATORY_CARE_PROVIDER_SITE_OTHER): Payer: 59 | Admitting: Pediatrics

## 2017-01-19 VITALS — BP 118/70 | HR 72 | Temp 97.6°F | Ht 66.0 in | Wt 181.0 lb

## 2017-01-19 DIAGNOSIS — Z124 Encounter for screening for malignant neoplasm of cervix: Secondary | ICD-10-CM | POA: Diagnosis not present

## 2017-01-19 DIAGNOSIS — F411 Generalized anxiety disorder: Secondary | ICD-10-CM

## 2017-01-19 MED ORDER — BUPROPION HCL ER (XL) 300 MG PO TB24: 300.0000 mg | ORAL_TABLET | Freq: Every day | ORAL | 3 refills | Status: DC

## 2017-01-19 MED ORDER — VENLAFAXINE HCL ER 150 MG PO CP24: ORAL_CAPSULE | ORAL | 3 refills | Status: DC

## 2017-01-19 NOTE — Progress Notes (Signed)
  Subjective:   Patient ID: Denise Love, female    DOB: [birth year], [age] y.o.   MRN: [id number] CC: Follow up chronic medical problems  HPI: Denise FreshwaterLauren Garfinkle is a 31 y.o. female presenting for Follow up chronic medical problems  More tired since baby born, now has 4 mo at home Not needing medication for heart burn now Migraines improved since delivery, was having symptoms during pregnancy primarily Back at work for about 1 mo, has been going well Husband staying home with baby for now, her mom helps some as well Taking wellbutrin and effexor daily Mood has been good, anxiety is well controlled  Baby healthy, growing well Not on birth control now, had to do IVF for this pregnancy, open to another pregnancy  Dr [facility] smear 2018 Flu shot through work  Relevant past medical, surgical, family and social history reviewed. Allergies and medications reviewed and updated. History  Smoking Status  . Former Smoker  . Packs/day: 0.25  . Types: Cigarettes  Smokeless Tobacco  . Never Used   ROS: Per HPI   Objective:    BP 118/70   Pulse 72   Temp 97.6 F (36.4 C) (Oral)   Ht 5\' 6"  (1.676 m)   Wt 181 lb (82.1 kg)   BMI 29.21 kg/m   Wt Readings from Last 3 Encounters:  01/19/17 181 lb (82.1 kg)  09/28/16 199 lb (90.3 kg)  11/27/15 169 lb 6.4 oz (76.8 kg)    Gen: NAD, alert, cooperative with exam, NCAT EYES: EOMI, no conjunctival injection, or no icterus LYMPH: no cervical LAD CV: NRRR, normal S1/S2, no murmur, distal pulses 2+ b/l Resp: CTABL, no wheezes, normal WOB Abd: +BS, soft, NTND. no guarding or organomegaly Ext: No edema, warm Neuro: Alert and oriented, strength equal b/l UE and LE, coordination grossly normal MSK: normal muscle bulk Psych: normal affect  Assessment & Plan:  Leotis ShamesLauren was seen today for follow up chronic medical problems.  Diagnoses and all orders for this visit:  GAD (generalized anxiety disorder) Symptoms stable, continue  below Not on birth control now, open to another pregnacy -     buPROPion (WELLBUTRIN XL) 300 MG 24 hr tablet; Take 1 tablet (300 mg total) by mouth daily. -     venlafaxine XR (EFFEXOR-XR) 150 MG 24 hr capsule; TAKE 1 CAPSULE DAILY WITH BREAKFAST  Cervical cancer screening Had pap smear 2018 at [facility]  Total time spent with the patient was 15 minutes with greater than 50% of the time spent in face-to-face consultation discussing as well as treatment going forward.   Follow up plan: Return in about 1 year (around 01/19/2018). Rex Krasarol Leonia Heatherly, MD [facility]

## 2017-05-18 DIAGNOSIS — Z713 Dietary counseling and surveillance: Secondary | ICD-10-CM | POA: Diagnosis not present

## 2017-05-18 DIAGNOSIS — Z136 Encounter for screening for cardiovascular disorders: Secondary | ICD-10-CM | POA: Diagnosis not present

## 2018-02-28 DIAGNOSIS — Z01419 Encounter for gynecological examination (general) (routine) without abnormal findings: Secondary | ICD-10-CM | POA: Diagnosis not present

## 2018-02-28 DIAGNOSIS — Z6826 Body mass index (BMI) 26.0-26.9, adult: Secondary | ICD-10-CM | POA: Diagnosis not present

## 2018-02-28 DIAGNOSIS — Z803 Family history of malignant neoplasm of breast: Secondary | ICD-10-CM | POA: Diagnosis not present

## 2018-02-28 DIAGNOSIS — Z801 Family history of malignant neoplasm of trachea, bronchus and lung: Secondary | ICD-10-CM | POA: Diagnosis not present

## 2018-03-02 LAB — HM PAP SMEAR: HPV, high-risk: NEGATIVE

## 2018-03-22 DIAGNOSIS — J00 Acute nasopharyngitis [common cold]: Secondary | ICD-10-CM | POA: Diagnosis not present

## 2018-03-22 DIAGNOSIS — R52 Pain, unspecified: Secondary | ICD-10-CM | POA: Diagnosis not present

## 2018-04-04 ENCOUNTER — Ambulatory Visit: Payer: 59 | Admitting: Pediatrics

## 2018-04-04 ENCOUNTER — Encounter: Payer: Self-pay | Admitting: Pediatrics

## 2018-04-04 VITALS — BP 108/66 | HR 85 | Temp 98.2°F | Ht 66.0 in | Wt 167.4 lb

## 2018-04-04 DIAGNOSIS — F39 Unspecified mood [affective] disorder: Secondary | ICD-10-CM

## 2018-04-04 DIAGNOSIS — F411 Generalized anxiety disorder: Secondary | ICD-10-CM

## 2018-04-04 MED ORDER — BUPROPION HCL ER (XL) 300 MG PO TB24: 300.0000 mg | ORAL_TABLET | Freq: Every day | ORAL | 3 refills | Status: DC

## 2018-04-04 MED ORDER — VENLAFAXINE HCL ER 150 MG PO CP24: ORAL_CAPSULE | ORAL | 3 refills | Status: DC

## 2018-04-04 NOTE — Patient Instructions (Signed)
[email or web address]

## 2018-04-04 NOTE — Progress Notes (Signed)
  Subjective:   Patient ID: Denise Love, female    DOB: 1986/02/24, 33 y.o.   MRN: [id number] CC: Medical Management of Chronic Issues  HPI: Denise Love is a 33 y.o. female   Anxiety and depression: Taking venlafaxine and bupropion regularly.  Has been helping for the most part.  In the last few weeks has had some more symptoms of anxiety or depressed mood come through.  Increased stress at home.  Not happening every day.  Feels safe at home.  No thoughts of self-harm.  Appetite is been fine.  No fevers.  Relevant past medical, surgical, family and social history reviewed. Allergies and medications reviewed and updated. Social History   Tobacco Use  Smoking Status Former Smoker  . Packs/day: 0.25  . Types: Cigarettes  Smokeless Tobacco Never Used   ROS: Per HPI   Objective:    BP 108/66   Pulse 85   Temp 98.2 F (36.8 C) (Oral)   Ht 5\' 6"  (1.676 m)   Wt 167 lb 6.4 oz (75.9 kg)   BMI 27.02 kg/m   Wt Readings from Last 3 Encounters:  04/04/18 167 lb 6.4 oz (75.9 kg)  01/19/17 181 lb (82.1 kg)  09/28/16 199 lb (90.3 kg)    Gen: NAD, alert, cooperative with exam, NCAT EYES: EOMI, no conjunctival injection, or no icterus ENT:   OP without erythema LYMPH: no cervical LAD CV: NRRR, normal S1/S2, no murmur, distal pulses 2+ b/l Resp: CTABL, no wheezes, normal WOB Abd: +BS, soft, NTND. no guarding or organomegaly Ext: No edema, warm Neuro: Alert and oriented MSK: normal muscle bulk Psych: Normal affect.  Mood is been okay.  Assessment & Plan:  Orphie was seen today for medical management of chronic issues.  We will continue below medicines unchanged.  Some ongoing symptoms.  Recommend starting with counseling prior to changing medicines.  If ongoing symptoms, worsening symptoms, patient to let us know. Patient in agreement with plan. Diagnoses and all orders for this visit:  GAD (generalized anxiety disorder) -     venlafaxine XR (EFFEXOR-XR) 150 MG 24 hr capsule;  TAKE 1 CAPSULE DAILY WITH BREAKFAST  Mood disorder (HCC) -     buPROPion (WELLBUTRIN XL) 300 MG 24 hr tablet; Take 1 tablet (300 mg total) by mouth daily.   Follow up plan: Return in about 1 year (around 04/05/2019). Rex Kras, MD [facility]

## 2018-04-12 DIAGNOSIS — Z809 Family history of malignant neoplasm, unspecified: Secondary | ICD-10-CM | POA: Diagnosis not present

## 2018-12-11 ENCOUNTER — Other Ambulatory Visit: Payer: Self-pay

## 2018-12-11 ENCOUNTER — Encounter: Payer: Self-pay | Admitting: Family Medicine

## 2018-12-11 ENCOUNTER — Ambulatory Visit (INDEPENDENT_AMBULATORY_CARE_PROVIDER_SITE_OTHER): Payer: 59 | Admitting: Family Medicine

## 2018-12-11 DIAGNOSIS — N3090 Cystitis, unspecified without hematuria: Secondary | ICD-10-CM

## 2018-12-11 DIAGNOSIS — R300 Dysuria: Secondary | ICD-10-CM | POA: Diagnosis not present

## 2018-12-11 MED ORDER — PHENAZOPYRIDINE HCL 100 MG PO TABS: 100.0000 mg | ORAL_TABLET | Freq: Three times a day (TID) | ORAL | 0 refills | Status: DC | PRN

## 2018-12-11 MED ORDER — NITROFURANTOIN MONOHYD MACRO 100 MG PO CAPS: 100.0000 mg | ORAL_CAPSULE | Freq: Two times a day (BID) | ORAL | 0 refills | Status: AC

## 2018-12-11 NOTE — Progress Notes (Signed)
Virtual Visit via telephone Note Due to COVID-19 pandemic this visit was conducted virtually. This visit type was conducted due to national recommendations for restrictions regarding the COVID-19 Pandemic (e.g. social distancing, sheltering in place) in an effort to limit this patient's exposure and mitigate transmission in our community. All issues noted in this document were discussed and addressed.  A physical exam was not performed with this format.   I connected with Denise Love on 12/11/18 at 0920 by telephone and verified that I am speaking with the correct person using two identifiers. Denise Love is currently located at home and family is currently with them during visit. The provider, Kari BaarsMichelle , FNP is located in their office at time of visit.  I discussed the limitations, risks, security and privacy concerns of performing an evaluation and management service by telephone and the availability of in person appointments. I also discussed with the patient that there may be a patient responsible charge related to this service. The patient expressed understanding and agreed to proceed.  Subjective:  Patient ID: Denise Love, female    DOB: February 07, 1986, 33 y.o.   MRN: [id number]  Chief Complaint:  Dysuria   HPI: Denise Love is a 33 y.o. female presenting on 12/11/2018 for Dysuria   Dysuria without fever, chills, flank pain, or hematuria. No vaginal symptoms. States she has had this in the past and ended up having an UTI. Denies pregnancy. No confusion, weakness, or fatigue.   Dysuria  This is a new problem. The current episode started in the past 7 days. The problem occurs every urination. The problem has been gradually worsening. The quality of the pain is described as aching and burning. The pain is at a severity of 4/10. The pain is mild. There has been no fever. She is sexually active. There is no history of pyelonephritis. Associated symptoms include frequency and  urgency. Pertinent negatives include no chills, discharge, flank pain, hematuria, hesitancy, nausea, possible pregnancy, sweats or vomiting. She has tried increased fluids for the symptoms. The treatment provided no relief.     Relevant past medical, surgical, family, and social history reviewed and updated as indicated.  Allergies and medications reviewed and updated.   Past Medical History:  Diagnosis Date  . Anxiety   . GERD (gastroesophageal reflux disease)     Past Surgical History:  Procedure Laterality Date  . DILATION AND CURETTAGE, DIAGNOSTIC / THERAPEUTIC      Social History   Socioeconomic History  . Marital status: Married    Spouse name: Not on file  . Number of children: Not on file  . Years of education: Not on file  . Highest education level: Not on file  Occupational History  . Not on file  Social Needs  . Financial resource strain: Not on file  . Food insecurity    Worry: Not on file    Inability: Not on file  . Transportation needs    Medical: Not on file    Non-medical: Not on file  Tobacco Use  . Smoking status: Former Smoker    Packs/day: 0.25    Types: Cigarettes  . Smokeless tobacco: Never Used  Substance and Sexual Activity  . Alcohol use: Yes    Alcohol/week: 6.0 standard drinks    Types: 6 Cans of beer per week  . Drug use: No  . Sexual activity: Never  Lifestyle  . Physical activity    Days per week: Not on file    Minutes per session:  Not on file  . Stress: Not on file  Relationships  . Social Herbalist on phone: Not on file    Gets together: Not on file    Attends religious service: Not on file    Active member of club or organization: Not on file    Attends meetings of clubs or organizations: Not on file    Relationship status: Not on file  . Intimate partner violence    Fear of current or ex partner: Not on file    Emotionally abused: Not on file    Physically abused: Not on file    Forced sexual activity: Not  on file  Other Topics Concern  . Not on file  Social History Narrative  . Not on file    Outpatient Encounter Medications as of 12/11/2018  Medication Sig  . buPROPion (WELLBUTRIN XL) 300 MG 24 hr tablet Take 1 tablet (300 mg total) by mouth daily.  . nitrofurantoin, macrocrystal-monohydrate, (MACROBID) 100 MG capsule Take 1 capsule (100 mg total) by mouth 2 (two) times daily for 7 days. 1 po BId  . phenazopyridine (PYRIDIUM) 100 MG tablet Take 1 tablet (100 mg total) by mouth 3 (three) times daily as needed for pain.  . Prenatal Vit-Fe Fumarate-FA (PRENATAL MULTIVITAMIN) TABS tablet Take 1 tablet by mouth daily at 12 noon.  . venlafaxine XR (EFFEXOR-XR) 150 MG 24 hr capsule TAKE 1 CAPSULE DAILY WITH BREAKFAST   No facility-administered encounter medications on file as of 12/11/2018.     No Known Allergies  Review of Systems  Constitutional: Negative for activity change, appetite change, chills, diaphoresis, fatigue and fever.  HENT: Negative.   Eyes: Negative.   Respiratory: Negative for cough, chest tightness and shortness of breath.   Cardiovascular: Negative for chest pain, palpitations and leg swelling.  Gastrointestinal: Negative for abdominal pain (lower abdominal pressure with voiding), blood in stool, constipation, diarrhea, nausea and vomiting.  Endocrine: Negative.   Genitourinary: Positive for dysuria, frequency and urgency. Negative for decreased urine volume, difficulty urinating, dyspareunia, enuresis, flank pain, genital sores, hematuria, hesitancy, menstrual problem, pelvic pain, vaginal bleeding, vaginal discharge and vaginal pain.  Musculoskeletal: Negative for arthralgias, back pain and myalgias.  Skin: Negative.   Allergic/Immunologic: Negative.   Neurological: Negative for dizziness, weakness and headaches.  Hematological: Negative.   Psychiatric/Behavioral: Negative for confusion, hallucinations, sleep disturbance and suicidal ideas.  All other systems reviewed  and are negative.        Observations/Objective: No vital signs or physical exam, this was a telephone or virtual health encounter.  Pt alert and oriented, answers all questions appropriately, and able to speak in full sentences.    Assessment and Plan: Vidalia was seen today for dysuria.  Diagnoses and all orders for this visit:  Dysuria Cystitis Reported symptoms consistent with acute cystitis. No red flags concerning for pyelonephritis or urosepsis. Will initiate below. Symptomatic care discussed in detail. Avoid bladder irritants such as caffeine. Report any new, worsening, or persistent problems. Medications as prescribed.  -     phenazopyridine (PYRIDIUM) 100 MG tablet; Take 1 tablet (100 mg total) by mouth 3 (three) times daily as needed for pain. -     nitrofurantoin, macrocrystal-monohydrate, (MACROBID) 100 MG capsule; Take 1 capsule (100 mg total) by mouth 2 (two) times daily for 7 days. 1 po BId     Follow Up Instructions: Return if symptoms worsen or fail to improve.    I discussed the assessment and treatment plan with  the patient. The patient was provided an opportunity to ask questions and all were answered. The patient agreed with the plan and demonstrated an understanding of the instructions.   The patient was advised to call back or seek an in-person evaluation if the symptoms worsen or if the condition fails to improve as anticipated.  The above assessment and management plan was discussed with the patient. The patient verbalized understanding of and has agreed to the management plan. Patient is aware to call the clinic if they develop any new symptoms or if symptoms persist or worsen. Patient is aware when to return to the clinic for a follow-up visit. Patient educated on when it is appropriate to go to the emergency department.    I provided 15 minutes of non-face-to-face time during this encounter. The call started at 920. The call ended at 0935. The other  time was used for coordination of care.    Kari Baars, FNP-C [facility] [address] [place], [place] [address] [phone] 12/11/18

## 2019-01-18 ENCOUNTER — Ambulatory Visit: Payer: 59

## 2019-01-19 ENCOUNTER — Other Ambulatory Visit: Payer: Self-pay

## 2019-01-19 ENCOUNTER — Encounter: Payer: Self-pay | Admitting: Nurse Practitioner

## 2019-01-19 ENCOUNTER — Ambulatory Visit (INDEPENDENT_AMBULATORY_CARE_PROVIDER_SITE_OTHER): Payer: 59 | Admitting: Nurse Practitioner

## 2019-01-19 DIAGNOSIS — N3001 Acute cystitis with hematuria: Secondary | ICD-10-CM

## 2019-01-19 MED ORDER — CEPHALEXIN 500 MG PO CAPS: 500.0000 mg | ORAL_CAPSULE | Freq: Two times a day (BID) | ORAL | 0 refills | Status: DC

## 2019-01-19 NOTE — Progress Notes (Signed)
   Virtual Visit via telephone Note Due to COVID-19 pandemic this visit was conducted virtually. This visit type was conducted due to national recommendations for restrictions regarding the COVID-19 Pandemic (e.g. social distancing, sheltering in place) in an effort to limit this patient's exposure and mitigate transmission in our community. All issues noted in this document were discussed and addressed.  A physical exam was not performed with this format.  I connected with Denise Love on 01/19/19 at 11:30 by telephone and verified that I am speaking with the correct person using two identifiers. Denise Love is currently located at home and no one is currently with her during visit. The provider, Mary-Margaret Hassell Done, FNP is located in their office at time of visit.  I discussed the limitations, risks, security and privacy concerns of performing an evaluation and management service by telephone and the availability of in person appointments. I also discussed with the patient that there may be a patient responsible charge related to this service. The patient expressed understanding and agreed to proceed.   History and Present Illness:   Chief Complaint: Urinary Tract Infection   HPI Patient calls in c/o UTI symptoms. She was treated for UTI in September and as treated with macrobid. Today she has dysuria, frequency and urency with some blood in urine.   Review of Systems  Constitutional: Negative for diaphoresis and weight loss.  Eyes: Negative for blurred vision, double vision and pain.  Respiratory: Negative for shortness of breath.   Cardiovascular: Negative for chest pain, palpitations, orthopnea and leg swelling.  Gastrointestinal: Negative for abdominal pain.  Genitourinary: Positive for dysuria, frequency, hematuria and urgency. Negative for flank pain.  Skin: Negative for rash.  Neurological: Negative for dizziness, sensory change, loss of consciousness, weakness and  headaches.  Endo/Heme/Allergies: Negative for polydipsia. Does not bruise/bleed easily.  Psychiatric/Behavioral: Negative for memory loss. The patient does not have insomnia.   All other systems reviewed and are negative.    Observations/Objective: Alert and oriented- answers all questions appropriately No distress No CVA tendernss   Assessment and Plan: Denise Love in today with chief complaint of Urinary Tract Infection   1. Acute cystitis with hematuria Take medication as prescribe Cotton underwear Take shower not bath Cranberry juice, yogurt Force fluids AZO over the counter X2 days RTO prn  - cephALEXin (KEFLEX) 500 MG capsule; Take 1 capsule (500 mg total) by mouth 2 (two) times daily.  Dispense: 14 capsule; Refill: 0   Follow Up Instructions: prn    I discussed the assessment and treatment plan with the patient. The patient was provided an opportunity to ask questions and all were answered. The patient agreed with the plan and demonstrated an understanding of the instructions.   The patient was advised to call back or seek an in-person evaluation if the symptoms worsen or if the condition fails to improve as anticipated.  The above assessment and management plan was discussed with the patient. The patient verbalized understanding of and has agreed to the management plan. Patient is aware to call the clinic if symptoms persist or worsen. Patient is aware when to return to the clinic for a follow-up visit. Patient educated on when it is appropriate to go to the emergency department.   Time call ended:  11:40  I provided 10 minutes of non-face-to-face time during this encounter.    Mary-Margaret Hassell Done, FNP

## 2019-05-21 ENCOUNTER — Other Ambulatory Visit: Payer: Self-pay | Admitting: *Deleted

## 2019-05-21 DIAGNOSIS — F39 Unspecified mood [affective] disorder: Secondary | ICD-10-CM

## 2019-05-21 DIAGNOSIS — F411 Generalized anxiety disorder: Secondary | ICD-10-CM

## 2019-05-21 MED ORDER — BUPROPION HCL ER (XL) 300 MG PO TB24: 300.0000 mg | ORAL_TABLET | Freq: Every day | ORAL | 0 refills | Status: DC

## 2019-05-21 MED ORDER — VENLAFAXINE HCL ER 150 MG PO CP24: ORAL_CAPSULE | ORAL | 0 refills | Status: DC

## 2019-06-11 ENCOUNTER — Other Ambulatory Visit: Payer: Self-pay | Admitting: Family Medicine

## 2019-06-11 NOTE — Telephone Encounter (Signed)
Pt's insurance will not fill 30 days, pt has not been seen since 03/25/18. Has appt on 06/13/19

## 2019-06-11 NOTE — Telephone Encounter (Signed)
  Medication Request  06/11/2019  What is the name of the medication? Bupropion 300 mg Patient has appt with Marcelino Duster 3-24 and needs some called in to get her through til Wednesday  Have you contacted your pharmacy to request a refill? YES  Which pharmacy would you like this sent to? [facility] in [place]   Patient notified that their request is being sent to the clinical staff for review and that they should receive a call once it is complete. If they do not receive a call within 24 hours they can check with their pharmacy or our office.

## 2019-06-13 ENCOUNTER — Encounter: Payer: Self-pay | Admitting: Family Medicine

## 2019-06-13 ENCOUNTER — Ambulatory Visit (INDEPENDENT_AMBULATORY_CARE_PROVIDER_SITE_OTHER): Payer: 59 | Admitting: Family Medicine

## 2019-06-13 ENCOUNTER — Other Ambulatory Visit: Payer: Self-pay

## 2019-06-13 VITALS — BP 111/71 | HR 70 | Temp 98.4°F | Resp 20 | Ht 66.0 in | Wt 158.0 lb

## 2019-06-13 DIAGNOSIS — F411 Generalized anxiety disorder: Secondary | ICD-10-CM

## 2019-06-13 DIAGNOSIS — Z79899 Other long term (current) drug therapy: Secondary | ICD-10-CM

## 2019-06-13 DIAGNOSIS — F339 Major depressive disorder, recurrent, unspecified: Secondary | ICD-10-CM

## 2019-06-13 DIAGNOSIS — D649 Anemia, unspecified: Secondary | ICD-10-CM | POA: Diagnosis not present

## 2019-06-13 MED ORDER — VENLAFAXINE HCL ER 150 MG PO CP24: ORAL_CAPSULE | ORAL | 11 refills | Status: DC

## 2019-06-13 MED ORDER — VENLAFAXINE HCL ER 150 MG PO CP24: ORAL_CAPSULE | ORAL | 5 refills | Status: DC

## 2019-06-13 MED ORDER — BUPROPION HCL ER (XL) 300 MG PO TB24: 300.0000 mg | ORAL_TABLET | Freq: Every day | ORAL | 11 refills | Status: DC

## 2019-06-13 MED ORDER — BUPROPION HCL ER (XL) 300 MG PO TB24: 300.0000 mg | ORAL_TABLET | Freq: Every day | ORAL | 5 refills | Status: DC

## 2019-06-13 NOTE — Patient Instructions (Signed)
If your symptoms worsen or you have thoughts of suicide/homicide, PLEASE SEEK IMMEDIATE MEDICAL ATTENTION.  You may always call the National Suicide Hotline.  This is available 24 hours a day, 7 days a week.  Their number is: 1-800-273-8255  Taking the medicine as directed and not missing any doses is one of the best things you can do to treat your depression.  Here are some things to keep in mind:  1) Side effects (stomach upset, some increased anxiety) may happen before you notice a benefit.  These side effects typically go away over time. 2) Changes to your dose of medicine or a change in medication all together is sometimes necessary 3) Most people need to be on medication at least 12 months 4) Many people will notice an improvement within two weeks but the full effect of the medication can take up to 4-6 weeks 5) Stopping the medication when you start feeling better often results in a return of symptoms 6) Never discontinue your medication without contacting a health care professional first.  Some medications require gradual discontinuation/ taper and can make you sick if you stop them abruptly.  If your symptoms worsen or you have thoughts of suicide/homicide, PLEASE SEEK IMMEDIATE MEDICAL ATTENTION.  You may always call:  National Suicide Hotline: 800-273-8255  Crisis Line: 336-832-9700 Crisis Recovery in [place]: 800-939-5911   These are available 24 hours a day, 7 days a week.   

## 2019-06-13 NOTE — Progress Notes (Signed)
Subjective:  Patient ID: Denise Love, female    DOB: 10/14/1985, 34 y.o.   MRN: [id number]  Patient Care Team: Baruch Gouty, FNP as PCP - General (Family Medicine)   Chief Complaint:  Medical Management of Chronic Issues   HPI: Denise Love is a 34 y.o. female presenting on 06/13/2019 for Medical Management of Chronic Issues   Pt following up today for management of anxiety and depression. Pt states she is doing very well on her current medication regimen. Denies associated side effects. She was noted to be slightly anemic in the past, no repeat CBC. Denies symptoms.   Depression      The patient presents with depression.  This is a chronic problem.  The current episode started more than 1 year ago.   The problem occurs intermittently.  The problem has been waxing and waning since onset.  Associated symptoms include fatigue, insomnia, irritable, restlessness, decreased interest and sad.  Associated symptoms include no decreased concentration, no helplessness, no hopelessness, no appetite change, no body aches, no myalgias, no headaches, no indigestion and no suicidal ideas.     The symptoms are aggravated by work stress and family issues.  Past treatments include SNRIs - Serotonin and norepinephrine reuptake inhibitors and other medications.  Compliance with treatment is good.  Previous treatment provided significant relief.  Past medical history includes anxiety and depression.   Anxiety Presents for follow-up visit. Symptoms include depressed mood, insomnia, irritability, nervous/anxious behavior and restlessness. Patient reports no chest pain, compulsions, confusion, decreased concentration, dizziness, dry mouth, excessive worry, feeling of choking, hyperventilation, impotence, malaise, muscle tension, nausea, obsessions, palpitations, panic, shortness of breath or suicidal ideas. Symptoms occur occasionally. The severity of symptoms is mild. The quality of sleep is fair. Nighttime  awakenings: occasional.   Her past medical history is significant for depression. Compliance with medications is 76-100%.   Depression screen [facility] 2/9 06/13/2019 01/19/2017 11/27/2015 12/17/2014 11/18/2014  Decreased Interest 1 0 0 0 0  Down, Depressed, Hopeless 1 0 0 0 0  PHQ - 2 Score 2 0 0 0 0  Altered sleeping 1 - - 1 -  Tired, decreased energy 1 - - 1 -  Change in appetite 0 - - 0 -  Feeling bad or failure about yourself  0 - - 0 -  Trouble concentrating 0 - - 1 -  Moving slowly or fidgety/restless 0 - - 1 -  Suicidal thoughts 0 - - 0 -  PHQ-9 Score 4 - - 4 -  Difficult doing work/chores Not difficult at all - - - -   GAD 7 : Generalized Anxiety Score 06/13/2019  Nervous, Anxious, on Edge 1  Control/stop worrying 0  Worry too much - different things 0  Trouble relaxing 1  Restless 0  Easily annoyed or irritable 1  Afraid - awful might happen 0  Total GAD 7 Score 3  Anxiety Difficulty Somewhat difficult        Relevant past medical, surgical, family, and social history reviewed and updated as indicated.  Allergies and medications reviewed and updated. Date reviewed: Chart in Epic.   Past Medical History:  Diagnosis Date  . Anxiety   . GERD (gastroesophageal reflux disease)     Past Surgical History:  Procedure Laterality Date  . DILATION AND CURETTAGE, DIAGNOSTIC / THERAPEUTIC      Social History   Socioeconomic History  . Marital status: Married    Spouse name: Not on file  . Number of  children: Not on file  . Years of education: Not on file  . Highest education level: Not on file  Occupational History  . Not on file  Tobacco Use  . Smoking status: Former Smoker    Packs/day: 0.25    Types: Cigarettes  . Smokeless tobacco: Never Used  Substance and Sexual Activity  . Alcohol use: Yes    Alcohol/week: 6.0 standard drinks    Types: 6 Cans of beer per week  . Drug use: No  . Sexual activity: Never  Other Topics Concern  . Not on file  Social History  Narrative  . Not on file   Social Determinants of Health   Financial Resource Strain:   . Difficulty of Paying Living Expenses:   Food Insecurity:   . Worried About Running Out of Food in the Last Year:   . Ran Out of Food in the Last Year:   Transportation Needs:   . Lack of Transportation (Medical):   . Lack of Transportation (Non-Medical):   Physical Activity:   . Days of Exercise per Week:   . Minutes of Exercise per Session:   Stress:   . Feeling of Stress :   Social Connections:   . Frequency of Communication with Friends and Family:   . Frequency of Social Gatherings with Friends and Family:   . Attends Religious Services:   . Active Member of Clubs or Organizations:   . Attends Club or Organization Meetings:   . Marital Status:   Intimate Partner Violence:   . Fear of Current or Ex-Partner:   . Emotionally Abused:   . Physically Abused:   . Sexually Abused:     Outpatient Encounter Medications as of 06/13/2019  Medication Sig  . buPROPion (WELLBUTRIN XL) 300 MG 24 hr tablet Take 1 tablet (300 mg total) by mouth daily. (Needs to be seen before next refill)  . Prenatal Vit-Fe Fumarate-FA (PRENATAL MULTIVITAMIN) TABS tablet Take 1 tablet by mouth daily at 12 noon.  . venlafaxine XR (EFFEXOR-XR) 150 MG 24 hr capsule TAKE 1 CAPSULE DAILY WITH BREAKFAST (Needs to be seen before next refill)  . [DISCONTINUED] buPROPion (WELLBUTRIN XL) 300 MG 24 hr tablet Take 1 tablet (300 mg total) by mouth daily. (Needs to be seen before next refill)  . [DISCONTINUED] buPROPion (WELLBUTRIN XL) 300 MG 24 hr tablet Take 1 tablet (300 mg total) by mouth daily. (Needs to be seen before next refill)  . [DISCONTINUED] venlafaxine XR (EFFEXOR-XR) 150 MG 24 hr capsule TAKE 1 CAPSULE DAILY WITH BREAKFAST (Needs to be seen before next refill)  . [DISCONTINUED] venlafaxine XR (EFFEXOR-XR) 150 MG 24 hr capsule TAKE 1 CAPSULE DAILY WITH BREAKFAST (Needs to be seen before next refill)  . [DISCONTINUED]  cephALEXin (KEFLEX) 500 MG capsule Take 1 capsule (500 mg total) by mouth 2 (two) times daily.  . [DISCONTINUED] phenazopyridine (PYRIDIUM) 100 MG tablet Take 1 tablet (100 mg total) by mouth 3 (three) times daily as needed for pain.   No facility-administered encounter medications on file as of 06/13/2019.    No Known Allergies  Review of Systems  Constitutional: Positive for fatigue and irritability. Negative for activity change, appetite change, chills, diaphoresis, fever and unexpected weight change.  HENT: Negative.   Eyes: Negative.  Negative for photophobia and visual disturbance.  Respiratory: Negative for cough, chest tightness and shortness of breath.   Cardiovascular: Negative for chest pain, palpitations and leg swelling.  Gastrointestinal: Negative for abdominal pain, blood in stool, constipation, diarrhea,   nausea and vomiting.  Endocrine: Negative.  Negative for cold intolerance, heat intolerance, polydipsia, polyphagia and polyuria.  Genitourinary: Negative for decreased urine volume, difficulty urinating, dysuria, frequency, impotence and urgency.  Musculoskeletal: Negative for arthralgias and myalgias.  Skin: Negative.   Allergic/Immunologic: Negative.   Neurological: Negative for dizziness, tremors, seizures, syncope, facial asymmetry, speech difficulty, weakness, light-headedness, numbness and headaches.  Hematological: Negative.   Psychiatric/Behavioral: Positive for agitation, depression, dysphoric mood and sleep disturbance. Negative for behavioral problems, confusion, decreased concentration, hallucinations, self-injury and suicidal ideas. The patient is nervous/anxious and has insomnia. The patient is not hyperactive.   All other systems reviewed and are negative.       Objective:  BP 111/71   Pulse 70   Temp 98.4 F (36.9 C)   Resp 20   Ht 5' 6" (1.676 m)   Wt 158 lb (71.7 kg)   LMP 05/23/2019   SpO2 100%   BMI 25.50 kg/m    Wt Readings from Last 3  Encounters:  06/13/19 158 lb (71.7 kg)  04/04/18 167 lb 6.4 oz (75.9 kg)  01/19/17 181 lb (82.1 kg)    Physical Exam Vitals and nursing note reviewed.  Constitutional:      General: She is irritable. She is not in acute distress.    Appearance: Normal appearance. She is well-developed, well-groomed and normal weight. She is not ill-appearing, toxic-appearing or diaphoretic.  HENT:     Head: Normocephalic and atraumatic.     Jaw: There is normal jaw occlusion.     Right Ear: Hearing normal.     Left Ear: Hearing normal.     Nose: Nose normal.     Mouth/Throat:     Lips: Pink.     Mouth: Mucous membranes are moist.     Pharynx: Oropharynx is clear. Uvula midline.  Eyes:     General: Lids are normal.     Extraocular Movements: Extraocular movements intact.     Conjunctiva/sclera: Conjunctivae normal.     Pupils: Pupils are equal, round, and reactive to light.  Neck:     Thyroid: No thyroid mass, thyromegaly or thyroid tenderness.     Vascular: No carotid bruit or JVD.     Trachea: Trachea and phonation normal.  Cardiovascular:     Rate and Rhythm: Normal rate and regular rhythm.     Chest Wall: PMI is not displaced.     Pulses: Normal pulses.     Heart sounds: Normal heart sounds. No murmur. No friction rub. No gallop.   Pulmonary:     Effort: Pulmonary effort is normal. No respiratory distress.     Breath sounds: Normal breath sounds. No wheezing.  Abdominal:     General: Bowel sounds are normal. There is no distension or abdominal bruit.     Palpations: Abdomen is soft. There is no hepatomegaly or splenomegaly.     Tenderness: There is no abdominal tenderness. There is no right CVA tenderness or left CVA tenderness.     Hernia: No hernia is present.  Musculoskeletal:        General: Normal range of motion.     Cervical back: Normal range of motion and neck supple.     Right lower leg: No edema.     Left lower leg: No edema.  Lymphadenopathy:     Cervical: No cervical  adenopathy.  Skin:    General: Skin is warm and dry.     Capillary Refill: Capillary refill takes less than 2 seconds.       Coloration: Skin is not cyanotic, jaundiced or pale.     Findings: No rash.  Neurological:     General: No focal deficit present.     Mental Status: She is alert and oriented to person, place, and time.     Cranial Nerves: Cranial nerves are intact. No cranial nerve deficit.     Sensory: Sensation is intact. No sensory deficit.     Motor: Motor function is intact. No weakness.     Coordination: Coordination is intact. Coordination normal.     Gait: Gait is intact. Gait normal.     Deep Tendon Reflexes: Reflexes are normal and symmetric. Reflexes normal.  Psychiatric:        Attention and Perception: Attention and perception normal.        Mood and Affect: Mood and affect normal.        Speech: Speech normal.        Behavior: Behavior normal. Behavior is cooperative.        Thought Content: Thought content normal.        Cognition and Memory: Cognition and memory normal.        Judgment: Judgment normal.     Results for orders placed or performed during the hospital encounter of 09/28/16  CBC  Result Value Ref Range   WBC 17.2 (H) 4.0 - 10.5 K/uL   RBC 4.52 3.87 - 5.11 MIL/uL   Hemoglobin 14.7 12.0 - 15.0 g/dL   HCT 41.8 36.0 - 46.0 %   MCV 92.5 78.0 - 100.0 fL   MCH 32.5 26.0 - 34.0 pg   MCHC 35.2 30.0 - 36.0 g/dL   RDW 13.5 11.5 - 15.5 %   Platelets 201 150 - 400 K/uL  Rubella screen  Result Value Ref Range   Rubella <0.90 (L) Immune >0.99 index  RPR  Result Value Ref Range   RPR Ser Ql Non Reactive Non Reactive  OB RESULTS CONSOLE GC/Chlamydia  Result Value Ref Range   Gonorrhea Negative    Chlamydia Negative   OB RESULTS CONSOLE RPR  Result Value Ref Range   RPR Nonreactive   OB RESULTS CONSOLE HIV antibody  Result Value Ref Range   HIV Non-reactive   OB RESULTS CONSOLE Rubella Antibody  Result Value Ref Range   Rubella Immune   OB  RESULTS CONSOLE Hepatitis B surface antigen  Result Value Ref Range   Hepatitis B Surface Ag Negative   CBC  Result Value Ref Range   WBC 26.6 (H) 4.0 - 10.5 K/uL   RBC 3.65 (L) 3.87 - 5.11 MIL/uL   Hemoglobin 11.7 (L) 12.0 - 15.0 g/dL   HCT 33.5 (L) 36.0 - 46.0 %   MCV 91.8 78.0 - 100.0 fL   MCH 32.1 26.0 - 34.0 pg   MCHC 34.9 30.0 - 36.0 g/dL   RDW 13.5 11.5 - 15.5 %   Platelets 183 150 - 400 K/uL  Collect bld for placenta donatation  Result Value Ref Range   Placenta donation bld collect COLLECTED BY LABORATORY   OB RESULTS CONSOLE ABO/Rh  Result Value Ref Range   RH Type  Positive    ABO Grouping O   OB RESULTS CONSOLE Antibody Screen  Result Value Ref Range   Antibody Screen Negative        Pertinent labs & imaging results that were available during my care of the patient were reviewed by me and considered in my medical decision making.  Assessment & Plan:  Farran was   seen today for medical management of chronic issues.  Diagnoses and all orders for this visit:  Depression, recurrent ([place]) GAD (generalized anxiety disorder) Doing very well on below, will continue. Will recheck thyroid function today.  -     Thyroid Panel With TSH -     venlafaxine XR (EFFEXOR-XR) 150 MG 24 hr capsule; TAKE 1 CAPSULE DAILY WITH BREAKFAST (Needs to be seen before next refill) -     buPROPion (WELLBUTRIN XL) 300 MG 24 hr tablet; Take 1 tablet (300 mg total) by mouth daily. (Needs to be seen before next refill)  Low hemoglobin Asymptomatic. Will repeat CBC today.  -     CBC with Differential/Platelet  High risk medication use Wellbutrin and effexor. Will check renal and liver function today.  -     CBC with Differential/Platelet -     CMP14+EGFR  Continue all other maintenance medications.  Follow up plan: Return in about 1 year (around 06/12/2020), or if symptoms worsen or fail to improve.   Continue healthy lifestyle choices, including diet (rich in fruits, vegetables, and  lean proteins, and low in salt and simple carbohydrates) and exercise (at least 30 minutes of moderate physical activity daily).  Educational handout given for depression  The above assessment and management plan was discussed with the patient. The patient verbalized understanding of and has agreed to the management plan. Patient is aware to call the clinic if they develop any new symptoms or if symptoms persist or worsen. Patient is aware when to return to the clinic for a follow-up visit. Patient educated on when it is appropriate to go to the emergency department.   Monia Pouch, FNP-C [facility] [phone]

## 2019-06-14 LAB — THYROID PANEL WITH TSH
Free Thyroxine Index: 1.7 (ref 1.2–4.9)
T3 Uptake Ratio: 28 % (ref 24–39)
T4, Total: 6.2 ug/dL (ref 4.5–12.0)
TSH: 1.53 u[IU]/mL (ref 0.450–4.500)

## 2019-06-14 LAB — CBC WITH DIFFERENTIAL/PLATELET
Basophils Absolute: 0.1 10*3/uL (ref 0.0–0.2)
Basos: 1 %
EOS (ABSOLUTE): 0.1 10*3/uL (ref 0.0–0.4)
Eos: 2 %
Hematocrit: 40.2 % (ref 34.0–46.6)
Hemoglobin: 13.4 g/dL (ref 11.1–15.9)
Immature Grans (Abs): 0 10*3/uL (ref 0.0–0.1)
Immature Granulocytes: 0 %
Lymphocytes Absolute: 3.2 10*3/uL — ABNORMAL HIGH (ref 0.7–3.1)
Lymphs: 45 %
MCH: 31.2 pg (ref 26.6–33.0)
MCHC: 33.3 g/dL (ref 31.5–35.7)
MCV: 94 fL (ref 79–97)
Monocytes Absolute: 0.7 10*3/uL (ref 0.1–0.9)
Monocytes: 10 %
Neutrophils Absolute: 3 10*3/uL (ref 1.4–7.0)
Neutrophils: 42 %
Platelets: 232 10*3/uL (ref 150–450)
RBC: 4.3 x10E6/uL (ref 3.77–5.28)
RDW: 11.8 % (ref 11.7–15.4)
WBC: 7 10*3/uL (ref 3.4–10.8)

## 2019-06-14 LAB — CMP14+EGFR
ALT: 15 IU/L (ref 0–32)
AST: 19 IU/L (ref 0–40)
Albumin/Globulin Ratio: 2 (ref 1.2–2.2)
Albumin: 4.3 g/dL (ref 3.8–4.8)
Alkaline Phosphatase: 51 IU/L (ref 39–117)
BUN/Creatinine Ratio: 16 (ref 9–23)
BUN: 14 mg/dL (ref 6–20)
Bilirubin Total: 0.3 mg/dL (ref 0.0–1.2)
CO2: 20 mmol/L (ref 20–29)
Calcium: 9.5 mg/dL (ref 8.7–10.2)
Chloride: 103 mmol/L (ref 96–106)
Creatinine, Ser: 0.9 mg/dL (ref 0.57–1.00)
GFR calc Af Amer: 96 mL/min/1.73
GFR calc non Af Amer: 84 mL/min/1.73
Globulin, Total: 2.1 g/dL (ref 1.5–4.5)
Glucose: 92 mg/dL (ref 65–99)
Potassium: 4.1 mmol/L (ref 3.5–5.2)
Sodium: 138 mmol/L (ref 134–144)
Total Protein: 6.4 g/dL (ref 6.0–8.5)

## 2019-07-03 DIAGNOSIS — L409 Psoriasis, unspecified: Secondary | ICD-10-CM | POA: Insufficient documentation

## 2020-06-25 ENCOUNTER — Other Ambulatory Visit: Payer: Self-pay | Admitting: *Deleted

## 2020-06-25 DIAGNOSIS — F339 Major depressive disorder, recurrent, unspecified: Secondary | ICD-10-CM

## 2020-06-25 DIAGNOSIS — F411 Generalized anxiety disorder: Secondary | ICD-10-CM

## 2020-06-27 ENCOUNTER — Other Ambulatory Visit: Payer: Self-pay

## 2020-06-27 DIAGNOSIS — F339 Major depressive disorder, recurrent, unspecified: Secondary | ICD-10-CM

## 2020-06-27 DIAGNOSIS — F411 Generalized anxiety disorder: Secondary | ICD-10-CM

## 2020-11-11 ENCOUNTER — Other Ambulatory Visit: Payer: Self-pay | Admitting: Family Medicine

## 2020-11-11 DIAGNOSIS — F339 Major depressive disorder, recurrent, unspecified: Secondary | ICD-10-CM

## 2020-11-11 DIAGNOSIS — F411 Generalized anxiety disorder: Secondary | ICD-10-CM

## 2020-11-11 LAB — HM HEPATITIS C SCREENING LAB: HM Hepatitis Screen: NEGATIVE

## 2020-11-11 LAB — OB RESULTS CONSOLE HEPATITIS B SURFACE ANTIGEN: Hepatitis B Surface Ag: NEGATIVE

## 2020-11-11 LAB — OB RESULTS CONSOLE HIV ANTIBODY (ROUTINE TESTING): HIV: NONREACTIVE

## 2020-11-11 LAB — OB RESULTS CONSOLE RUBELLA ANTIBODY, IGM: Rubella: IMMUNE

## 2020-11-11 LAB — OB RESULTS CONSOLE RPR: RPR: NONREACTIVE

## 2020-12-16 ENCOUNTER — Encounter: Payer: Self-pay | Admitting: Family Medicine

## 2020-12-16 ENCOUNTER — Other Ambulatory Visit: Payer: Self-pay

## 2020-12-16 ENCOUNTER — Ambulatory Visit: Payer: Commercial Managed Care - PPO | Admitting: Family Medicine

## 2020-12-16 VITALS — BP 121/74 | HR 89 | Temp 98.2°F | Ht 66.0 in | Wt 178.0 lb

## 2020-12-16 DIAGNOSIS — F411 Generalized anxiety disorder: Secondary | ICD-10-CM

## 2020-12-16 DIAGNOSIS — F339 Major depressive disorder, recurrent, unspecified: Secondary | ICD-10-CM | POA: Diagnosis not present

## 2020-12-16 DIAGNOSIS — O30002 Twin pregnancy, unspecified number of placenta and unspecified number of amniotic sacs, second trimester: Secondary | ICD-10-CM | POA: Diagnosis not present

## 2020-12-16 MED ORDER — VENLAFAXINE HCL ER 150 MG PO CP24: ORAL_CAPSULE | ORAL | 2 refills | Status: DC

## 2020-12-16 MED ORDER — BUPROPION HCL ER (XL) 300 MG PO TB24: 300.0000 mg | ORAL_TABLET | Freq: Every day | ORAL | 2 refills | Status: DC

## 2020-12-16 NOTE — Progress Notes (Signed)
Established Patient Office Visit  Subjective:  Patient ID: Denise Love, female    DOB: 16-Oct-1985  Age: 35 y.o. MRN: [id number]  CC:  Chief Complaint  Patient presents with   Depression    HPI Denise Love presents for anxiety and depression follow up. She is currently pregnant with twins. She is 13 weeks. Her ob is [facility]. She currently take wellbutrin and effexor for her anxiety and depression. She has discussed this with her OB and they are aware that she is taking this during pregnancy. She reports taking these medications with her previous pregnancy as well. She reports that her anxiety and depression is well controlled and denies side effects.   Depression screen [facility] 2/9 12/16/2020 06/13/2019 01/19/2017  Decreased Interest 0 1 0  Down, Depressed, Hopeless 0 1 0  PHQ - 2 Score 0 2 0  Altered sleeping 0 1 -  Tired, decreased energy 1 1 -  Change in appetite 0 0 -  Feeling bad or failure about yourself  0 0 -  Trouble concentrating 0 0 -  Moving slowly or fidgety/restless 0 0 -  Suicidal thoughts 0 0 -  PHQ-9 Score 1 4 -  Difficult doing work/chores Somewhat difficult Not difficult at all -   GAD 7 : Generalized Anxiety Score 12/16/2020 06/13/2019  Nervous, Anxious, on Edge 0 1  Control/stop worrying 0 0  Worry too much - different things 0 0  Trouble relaxing 0 1  Restless 0 0  Easily annoyed or irritable 0 1  Afraid - awful might happen 0 0  Total GAD 7 Score 0 3  Anxiety Difficulty Not difficult at all Somewhat difficult      Past Medical History:  Diagnosis Date   Anxiety    GERD (gastroesophageal reflux disease)     Past Surgical History:  Procedure Laterality Date   DILATION AND CURETTAGE, DIAGNOSTIC / THERAPEUTIC      Family History  Problem Relation Age of Onset   Cancer Maternal Grandmother     Social History   Socioeconomic History   Marital status: Married    Spouse name: Not on file   Number of children: Not on file    Years of education: Not on file   Highest education level: Not on file  Occupational History   Not on file  Tobacco Use   Smoking status: Former    Packs/day: 0.25    Types: Cigarettes   Smokeless tobacco: Never  Substance and Sexual Activity   Alcohol use: Yes    Alcohol/week: 6.0 standard drinks    Types: 6 Cans of beer per week   Drug use: No   Sexual activity: Never  Other Topics Concern   Not on file  Social History Narrative   Not on file   Social Determinants of Health   Financial Resource Strain: Not on file  Food Insecurity: Not on file  Transportation Needs: Not on file  Physical Activity: Not on file  Stress: Not on file  Social Connections: Not on file  Intimate Partner Violence: Not on file    Outpatient Medications Prior to Visit  Medication Sig Dispense Refill   folic acid (FOLVITE) 1 MG tablet Take 1 mg by mouth daily.     Prenatal Vit-Fe Fumarate-FA (PRENATAL MULTIVITAMIN) TABS tablet Take 1 tablet by mouth daily at 12 noon.     buPROPion (WELLBUTRIN XL) 300 MG 24 hr tablet Take 1 tablet (300 mg total) by mouth daily. (Needs to  be seen before next refill) 90 tablet 5   venlafaxine XR (EFFEXOR-XR) 150 MG 24 hr capsule TAKE 1 CAPSULE DAILY WITH BREAKFAST (Needs to be seen before next refill) 90 capsule 5   No facility-administered medications prior to visit.    No Known Allergies  ROS Review of Systems As per HPI.    Objective:    Physical Exam Vitals and nursing note reviewed.  Constitutional:      General: She is not in acute distress.    Appearance: She is not ill-appearing, toxic-appearing or diaphoretic.  Cardiovascular:     Rate and Rhythm: Normal rate and regular rhythm.     Heart sounds: Normal heart sounds. No murmur heard. Pulmonary:     Effort: Pulmonary effort is normal. No respiratory distress.     Breath sounds: Normal breath sounds.  Musculoskeletal:     Right lower leg: No edema.     Left lower leg: No edema.  Skin:     General: Skin is warm and dry.  Neurological:     General: No focal deficit present.     Mental Status: She is alert and oriented to person, place, and time.  Psychiatric:        Mood and Affect: Mood normal.        Behavior: Behavior normal.    BP 121/74   Pulse 89   Temp 98.2 F (36.8 C) (Temporal)   Ht 5\' 6"  (1.676 m)   Wt 178 lb (80.7 kg)   BMI 28.73 kg/m  Wt Readings from Last 3 Encounters:  12/16/20 178 lb (80.7 kg)  06/13/19 158 lb (71.7 kg)  04/04/18 167 lb 6.4 oz (75.9 kg)     Health Maintenance Due  Topic Date Due   Hepatitis C Screening  Never done   PAP SMEAR-Modifier  Never done   COVID-19 Vaccine (3 - Mixed Product risk series) 01/10/2020   INFLUENZA VACCINE  10/20/2020    There are no preventive care reminders to display for this patient.  Lab Results  Component Value Date   TSH 1.530 06/13/2019   Lab Results  Component Value Date   WBC 7.0 06/13/2019   HGB 13.4 06/13/2019   HCT 40.2 06/13/2019   MCV 94 06/13/2019   PLT 232 06/13/2019   Lab Results  Component Value Date   NA 138 06/13/2019   K 4.1 06/13/2019   CO2 20 06/13/2019   GLUCOSE 92 06/13/2019   BUN 14 06/13/2019   CREATININE 0.90 06/13/2019   BILITOT 0.3 06/13/2019   ALKPHOS 51 06/13/2019   AST 19 06/13/2019   ALT 15 06/13/2019   PROT 6.4 06/13/2019   ALBUMIN 4.3 06/13/2019   CALCIUM 9.5 06/13/2019   No results found for: CHOL No results found for: HDL No results found for: LDLCALC No results found for: TRIG No results found for: CHOLHDL No results found for: 06/15/2019    Assessment & Plan:   Denise Love was seen today for depression.  Diagnoses and all orders for this visit:  Depression, recurrent ([facility]) GAD (generalized anxiety disorder) Well controlled on current regimen.  Patient is currently [redacted] weeks pregnant. OB is aware that patient is taking these medication. Discussed potential risk with these medications in pregnancy. Patient wishes to continue these.  -      buPROPion (WELLBUTRIN XL) 300 MG 24 hr tablet; Take 1 tablet (300 mg total) by mouth daily. -     venlafaxine XR (EFFEXOR-XR) 150 MG 24 hr capsule; TAKE 1 CAPSULE  DAILY WITH BREAKFAST  Twin gestation in second trimester, unspecified multiple gestation type  Follow-up: Return in about 3 months (around 03/17/2021) for with PCP for chronic follow up.   The patient indicates understanding of these issues and agrees with the plan.   Gabriel Earing, FNP

## 2020-12-17 ENCOUNTER — Ambulatory Visit: Payer: Self-pay | Admitting: Family Medicine

## 2021-01-07 ENCOUNTER — Other Ambulatory Visit: Payer: Self-pay | Admitting: Family Medicine

## 2021-01-07 DIAGNOSIS — F411 Generalized anxiety disorder: Secondary | ICD-10-CM

## 2021-01-07 DIAGNOSIS — F339 Major depressive disorder, recurrent, unspecified: Secondary | ICD-10-CM

## 2021-04-27 ENCOUNTER — Inpatient Hospital Stay (HOSPITAL_COMMUNITY)
Admission: AD | Admit: 2021-04-27 | Discharge: 2021-05-02 | DRG: 786 | Disposition: A | Discharge status: Home / Self Care | Payer: 59 | Attending: Obstetrics and Gynecology | Admitting: Obstetrics and Gynecology

## 2021-04-27 ENCOUNTER — Other Ambulatory Visit: Payer: Self-pay

## 2021-04-27 ENCOUNTER — Inpatient Hospital Stay (HOSPITAL_BASED_OUTPATIENT_CLINIC_OR_DEPARTMENT_OTHER): Payer: 59

## 2021-04-27 ENCOUNTER — Encounter (HOSPITAL_COMMUNITY): Payer: Self-pay | Admitting: *Deleted

## 2021-04-27 DIAGNOSIS — O26899 Other specified pregnancy related conditions, unspecified trimester: Secondary | ICD-10-CM

## 2021-04-27 DIAGNOSIS — O864 Pyrexia of unknown origin following delivery: Secondary | ICD-10-CM | POA: Diagnosis not present

## 2021-04-27 DIAGNOSIS — O09523 Supervision of elderly multigravida, third trimester: Secondary | ICD-10-CM | POA: Diagnosis not present

## 2021-04-27 DIAGNOSIS — O42919 Preterm premature rupture of membranes, unspecified as to length of time between rupture and onset of labor, unspecified trimester: Secondary | ICD-10-CM | POA: Diagnosis present

## 2021-04-27 DIAGNOSIS — O42913 Preterm premature rupture of membranes, unspecified as to length of time between rupture and onset of labor, third trimester: Secondary | ICD-10-CM | POA: Diagnosis present

## 2021-04-27 DIAGNOSIS — O9081 Anemia of the puerperium: Secondary | ICD-10-CM | POA: Diagnosis not present

## 2021-04-27 DIAGNOSIS — Z87891 Personal history of nicotine dependence: Secondary | ICD-10-CM

## 2021-04-27 DIAGNOSIS — O26893 Other specified pregnancy related conditions, third trimester: Secondary | ICD-10-CM | POA: Diagnosis present

## 2021-04-27 DIAGNOSIS — O99213 Obesity complicating pregnancy, third trimester: Secondary | ICD-10-CM

## 2021-04-27 DIAGNOSIS — E669 Obesity, unspecified: Secondary | ICD-10-CM | POA: Diagnosis not present

## 2021-04-27 DIAGNOSIS — O42113 Preterm premature rupture of membranes, onset of labor more than 24 hours following rupture, third trimester: Secondary | ICD-10-CM | POA: Diagnosis not present

## 2021-04-27 DIAGNOSIS — Z3A32 32 weeks gestation of pregnancy: Secondary | ICD-10-CM | POA: Diagnosis not present

## 2021-04-27 DIAGNOSIS — O328XX2 Maternal care for other malpresentation of fetus, fetus 2: Secondary | ICD-10-CM | POA: Diagnosis present

## 2021-04-27 DIAGNOSIS — Z20822 Contact with and (suspected) exposure to covid-19: Secondary | ICD-10-CM | POA: Diagnosis present

## 2021-04-27 DIAGNOSIS — O30043 Twin pregnancy, dichorionic/diamniotic, third trimester: Secondary | ICD-10-CM | POA: Diagnosis present

## 2021-04-27 DIAGNOSIS — N898 Other specified noninflammatory disorders of vagina: Secondary | ICD-10-CM

## 2021-04-27 DIAGNOSIS — D62 Acute posthemorrhagic anemia: Secondary | ICD-10-CM | POA: Diagnosis not present

## 2021-04-27 LAB — CBC
HCT: 36 % (ref 36.0–46.0)
Hemoglobin: 12.1 g/dL (ref 12.0–15.0)
MCH: 29.2 pg (ref 26.0–34.0)
MCHC: 33.6 g/dL (ref 30.0–36.0)
MCV: 87 fL (ref 80.0–100.0)
Platelets: 228 10*3/uL (ref 150–400)
RBC: 4.14 MIL/uL (ref 3.87–5.11)
RDW: 12.5 % (ref 11.5–15.5)
WBC: 11.7 10*3/uL — ABNORMAL HIGH (ref 4.0–10.5)
nRBC: 0 % (ref 0.0–0.2)

## 2021-04-27 LAB — TYPE AND SCREEN
ABO/RH(D): O POS
Antibody Screen: NEGATIVE

## 2021-04-27 LAB — URINALYSIS, ROUTINE W REFLEX MICROSCOPIC
Bilirubin Urine: NEGATIVE
Glucose, UA: NEGATIVE mg/dL
Ketones, ur: NEGATIVE mg/dL
Nitrite: NEGATIVE
Protein, ur: NEGATIVE mg/dL
Specific Gravity, Urine: 1.01 (ref 1.005–1.030)
pH: 6.5 (ref 5.0–8.0)

## 2021-04-27 LAB — WET PREP, GENITAL
Sperm: NONE SEEN
Trich, Wet Prep: NONE SEEN
WBC, Wet Prep HPF POC: >=10 — AB (ref <10)
Yeast Wet Prep HPF POC: NONE SEEN

## 2021-04-27 LAB — RESP PANEL BY RT-PCR (FLU A&B, COVID) ARPGX2
Influenza A by PCR: NEGATIVE
Influenza B by PCR: NEGATIVE
SARS Coronavirus 2 by RT PCR: NEGATIVE

## 2021-04-27 LAB — URINALYSIS, MICROSCOPIC (REFLEX)

## 2021-04-27 LAB — AMNISURE RUPTURE OF MEMBRANE (ROM) NOT AT ARMC: Amnisure ROM: POSITIVE

## 2021-04-27 MED ORDER — VENLAFAXINE HCL ER 150 MG PO CP24
150.0000 mg | ORAL_CAPSULE | Freq: Every day | ORAL | Status: DC
  Filled 2021-04-27: qty 1

## 2021-04-27 MED ORDER — BUPROPION HCL ER (XL) 300 MG PO TB24
300.0000 mg | ORAL_TABLET | Freq: Every day | ORAL | Status: DC
  Administered 2021-04-27: 300 mg via ORAL
  Filled 2021-04-27 (×2): qty 1

## 2021-04-27 MED ORDER — ACETAMINOPHEN 325 MG PO TABS: 650.0000 mg | ORAL_TABLET | ORAL | Status: DC | PRN

## 2021-04-27 MED ORDER — DOCUSATE SODIUM 100 MG PO CAPS
100.0000 mg | ORAL_CAPSULE | Freq: Every day | ORAL | Status: DC
  Administered 2021-04-27: 100 mg via ORAL
  Filled 2021-04-27: qty 1

## 2021-04-27 MED ORDER — PRENATAL MULTIVITAMIN CH
1.0000 | ORAL_TABLET | Freq: Every day | ORAL | Status: DC
  Administered 2021-04-27: 1 via ORAL
  Filled 2021-04-27: qty 1

## 2021-04-27 MED ORDER — ONDANSETRON HCL 4 MG PO TABS: 4.0000 mg | ORAL_TABLET | Freq: Three times a day (TID) | ORAL | Status: DC | PRN

## 2021-04-27 MED ORDER — LACTATED RINGERS IV SOLN: INTRAVENOUS | Status: DC

## 2021-04-27 MED ORDER — CALCIUM CARBONATE ANTACID 500 MG PO CHEW: 2.0000 | CHEWABLE_TABLET | ORAL | Status: DC | PRN

## 2021-04-27 MED ORDER — ZOLPIDEM TARTRATE 5 MG PO TABS
5.0000 mg | ORAL_TABLET | Freq: Every evening | ORAL | Status: DC | PRN
  Administered 2021-04-27: 5 mg via ORAL
  Filled 2021-04-27: qty 1

## 2021-04-27 MED ORDER — FAMOTIDINE 20 MG PO TABS
20.0000 mg | ORAL_TABLET | Freq: Two times a day (BID) | ORAL | Status: DC
  Administered 2021-04-27: 20 mg via ORAL
  Filled 2021-04-27: qty 1

## 2021-04-27 MED ORDER — BETAMETHASONE SOD PHOS & ACET 6 (3-3) MG/ML IJ SUSP
12.0000 mg | INTRAMUSCULAR | Status: DC
  Administered 2021-04-27: 12 mg via INTRAMUSCULAR
  Filled 2021-04-27: qty 5

## 2021-04-27 MED ORDER — SODIUM CHLORIDE 0.9 % IV SOLN
2.0000 g | Freq: Four times a day (QID) | INTRAVENOUS | Status: DC
  Administered 2021-04-27 – 2021-04-28 (×3): 2 g via INTRAVENOUS
  Filled 2021-04-27 (×3): qty 2000

## 2021-04-27 MED ORDER — METRONIDAZOLE 500 MG PO TABS
500.0000 mg | ORAL_TABLET | Freq: Two times a day (BID) | ORAL | Status: DC
  Administered 2021-04-27 (×2): 500 mg via ORAL
  Filled 2021-04-27 (×2): qty 1

## 2021-04-27 MED ORDER — AZITHROMYCIN 250 MG PO TABS
1000.0000 mg | ORAL_TABLET | Freq: Once | ORAL | Status: AC
  Administered 2021-04-27: 1000 mg via ORAL
  Filled 2021-04-27: qty 4

## 2021-04-27 MED ORDER — AMOXICILLIN 500 MG PO CAPS: 500.0000 mg | ORAL_CAPSULE | Freq: Three times a day (TID) | ORAL | Status: DC

## 2021-04-27 NOTE — MAU Provider Note (Signed)
History     CSN: [id number]  Arrival date and time: 04/27/21 0626   Event Date/Time   First Provider Initiated Contact with Patient 04/27/21 [phone]      Chief Complaint  Patient presents with   Rupture of Membranes   HPI This is a G3 P0-1-1-1 at 32 weeks and 2 days who is pregnancy is complicated by di/di twins.  She presents with possible rupture membrane.  She felt a big gush of fluid on Saturday followed by consistent leaking yesterday and slightly heavier leaking today.  She denies fevers, chills, nausea, vomiting.  She reports no contractions, no bleeding.  Fetal activity has been normal.  OB History     Gravida  3   Para  1   Term      Preterm  1   AB      Living  1      SAB      IAB      Ectopic      Multiple  0   Live Births  1           Past Medical History:  Diagnosis Date   Anxiety    GERD (gastroesophageal reflux disease)     Past Surgical History:  Procedure Laterality Date   DILATION AND CURETTAGE, DIAGNOSTIC / THERAPEUTIC      Family History  Problem Relation Age of Onset   Cancer Maternal Grandmother     Social History   Tobacco Use   Smoking status: Former    Packs/day: 0.25    Types: Cigarettes   Smokeless tobacco: Never  Substance Use Topics   Alcohol use: Yes    Alcohol/week: 6.0 standard drinks    Types: 6 Cans of beer per week   Drug use: No    Allergies: No Known Allergies  Medications Prior to Admission  Medication Sig Dispense Refill Last Dose   buPROPion (WELLBUTRIN XL) 300 MG 24 hr tablet TAKE 1 TABLET BY MOUTH EVERY DAY 90 tablet 0    folic acid (FOLVITE) 1 MG tablet Take 1 mg by mouth daily.      Prenatal Vit-Fe Fumarate-FA (PRENATAL MULTIVITAMIN) TABS tablet Take 1 tablet by mouth daily at 12 noon.      venlafaxine XR (EFFEXOR-XR) 150 MG 24 hr capsule TAKE 1 CAPSULE DAILY WITH BREAKFAST 90 capsule 0     Review of Systems Physical Exam   Blood pressure 127/86, pulse 100, temperature 98.4 F (36.9  C), resp. rate 20, height 5\' 6"  (1.676 m), weight 96.8 kg, SpO2 99 %, unknown if currently breastfeeding.  Physical Exam Vitals and nursing note reviewed. Exam conducted with a chaperone present.  Constitutional:      Appearance: Normal appearance.  Cardiovascular:     Rate and Rhythm: Normal rate and regular rhythm.     Pulses: Normal pulses.  Pulmonary:     Effort: Pulmonary effort is normal.  Genitourinary:    Labia:        Right: No rash, tenderness or lesion.        Left: No rash, tenderness or lesion.      Comments: Fundal height consistent with twin gestation Watery mucousy discharge  Neurological:     Mental Status: She is alert.  Psychiatric:        Mood and Affect: Mood normal.        Behavior: Behavior normal.        Thought Content: Thought content normal.  Judgment: Judgment normal.   Fern Neg Results for orders placed or performed during the hospital encounter of 04/27/21 (from the past 24 hour(s))  Wet prep, genital     Status: Abnormal   Collection Time: 04/27/21  9:15 AM   Specimen: Genital  Result Value Ref Range   Yeast Wet Prep HPF POC NONE SEEN NONE SEEN   Trich, Wet Prep NONE SEEN NONE SEEN   Clue Cells Wet Prep HPF POC PRESENT (A) NONE SEEN   WBC, Wet Prep HPF POC >=10 (A) <10   Sperm NONE SEEN   Amnisure rupture of membrane (rom)not at [facility]     Status: None   Collection Time: 04/27/21  9:33 AM  Result Value Ref Range   Amnisure ROM POSITIVE      MAU Course  Procedures NST: Baby A Baseline: 130  Variability: mod Accelerations: ++  Decelerations: none Contractions: irritability  NST: Baby B Baseline: 130  Variability: mod Accelerations: ++  Decelerations: none Contractions: irritability  MDM   Assessment and Plan   1. [redacted] weeks gestation of pregnancy   2. Vaginal discharge during pregnancy   3. Preterm premature rupture of membranes (PPROM) with unknown onset of labor   4. Dichorionic diamniotic twin pregnancy in third  trimester    Start IV Latency Abx T&S, CBC now and q 72 hours No contractions, hold off on Mag [place] detailed. Discussed with Dr Henderson Cloud - will admit.   Levie Heritage 04/27/2021, 9:19 AM

## 2021-04-27 NOTE — MAU Note (Signed)
Presents with c/o LOF, states had a gush of fluid on Saturday just once then trickled yesterday, but was heavier this morning.  Reports fluid is clear.  Denies VB.  Endorses +FM.

## 2021-04-27 NOTE — Plan of Care (Signed)
Problem: Education: °Goal: Knowledge of General Education information will improve °Description: Including pain rating scale, medication(s)/side effects and non-pharmacologic comfort measures °Outcome: Completed/Met °  °

## 2021-04-27 NOTE — H&P (Signed)
Denise Love is a 36 y.o. female presenting for leaking fluid Saturday with a single small gush. Since then she has an intermittent trickle. No bleeding, no strong UCs, no fever, no N/V. Pregnancy complicated by Di/Di IVF twins, normal fetal cardiac echo x 2. OB History     Gravida  4   Para  1   Term      Preterm  1   AB  2   Living  1      SAB  1   IAB  1   Ectopic      Multiple  0   Live Births  1          Past Medical History:  Diagnosis Date   Anxiety    GERD (gastroesophageal reflux disease)    Past Surgical History:  Procedure Laterality Date   DILATION AND CURETTAGE, DIAGNOSTIC / THERAPEUTIC     Family History: family history includes Cancer in her maternal grandmother. Social History:  reports that she has quit smoking. Her smoking use included cigarettes. She smoked an average of .25 packs per day. She has never used smokeless tobacco. She reports that she does not currently use alcohol after a past usage of about 6.0 standard drinks per week. She reports that she does not use drugs.     Maternal Diabetes: No Genetic Screening: Normal Maternal Ultrasounds/Referrals: Normal Fetal Ultrasounds or other Referrals:  Fetal echo Maternal Substance Abuse:  No Significant Maternal Medications:  None Significant Maternal Lab Results:  None Other Comments:  None  Review of Systems  Constitutional:  Negative for fever.  Gastrointestinal:  Negative for abdominal pain.  Maternal Medical History:  Fetal activity: Perceived fetal activity is normal.      Blood pressure 123/82, pulse 96, temperature 97.8 F (36.6 C), temperature source Oral, resp. rate 19, height 5\' 6"  (1.676 m), weight 96.8 kg, SpO2 98 %, unknown if currently breastfeeding. Exam Physical Exam Cardiovascular:     Rate and Rhythm: Normal rate.  Pulmonary:     Effort: Pulmonary effort is normal.  Abdominal:     Comments: Uterus soft, NT    Evaluation in MAU notes positive  Amniosure  Results for orders placed or performed during the hospital encounter of 04/27/21 (from the past 24 hour(s))  Wet prep, genital     Status: Abnormal   Collection Time: 04/27/21  9:15 AM   Specimen: Genital  Result Value Ref Range   Yeast Wet Prep HPF POC NONE SEEN NONE SEEN   Trich, Wet Prep NONE SEEN NONE SEEN   Clue Cells Wet Prep HPF POC PRESENT (A) NONE SEEN   WBC, Wet Prep HPF POC >=10 (A) <10   Sperm NONE SEEN   Amnisure rupture of membrane (rom)not at [facility]     Status: None   Collection Time: 04/27/21  9:33 AM  Result Value Ref Range   Amnisure ROM POSITIVE   Urinalysis, Routine w reflex microscopic Urine, Clean Catch     Status: Abnormal   Collection Time: 04/27/21  9:39 AM  Result Value Ref Range   Color, Urine YELLOW YELLOW   APPearance CLEAR CLEAR   Specific Gravity, Urine 1.010 1.005 - 1.030   pH 6.5 5.0 - 8.0   Glucose, UA NEGATIVE NEGATIVE mg/dL   Hgb urine dipstick TRACE (A) NEGATIVE   Bilirubin Urine NEGATIVE NEGATIVE   Ketones, ur NEGATIVE NEGATIVE mg/dL   Protein, ur NEGATIVE NEGATIVE mg/dL   Nitrite NEGATIVE NEGATIVE   Leukocytes,Ua SMALL (A)  NEGATIVE  Urinalysis, Microscopic (reflex)     Status: Abnormal   Collection Time: 04/27/21  9:39 AM  Result Value Ref Range   RBC / HPF 0-5 0 - 5 RBC/hpf   WBC, UA 0-5 0 - 5 WBC/hpf   Bacteria, UA RARE (A) NONE SEEN   Squamous Epithelial / LPF 6-10 0 - 5   Mucus PRESENT   Resp Panel by RT-PCR (Flu A&B, Covid) Nasopharyngeal Swab     Status: None   Collection Time: 04/27/21 10:50 AM   Specimen: Nasopharyngeal Swab; Nasopharyngeal(NP) swabs in vial transport medium  Result Value Ref Range   SARS Coronavirus 2 by RT PCR NEGATIVE NEGATIVE   Influenza A by PCR NEGATIVE NEGATIVE   Influenza B by PCR NEGATIVE NEGATIVE  CBC     Status: Abnormal   Collection Time: 04/27/21 12:19 PM  Result Value Ref Range   WBC 11.7 (H) 4.0 - 10.5 K/uL   RBC 4.14 3.87 - 5.11 MIL/uL   Hemoglobin 12.1 12.0 - 15.0 g/dL   HCT  66.4 40.3 - 47.4 %   MCV 87.0 80.0 - 100.0 fL   MCH 29.2 26.0 - 34.0 pg   MCHC 33.6 30.0 - 36.0 g/dL   RDW 25.9 56.3 - 87.5 %   Platelets 228 150 - 400 K/uL   nRBC 0.0 0.0 - 0.2 %  Type and screen [facility]     Status: None   Collection Time: 04/27/21 12:19 PM  Result Value Ref Range   ABO/RH(D) O POS    Antibody Screen NEG    Sample Expiration      04/30/2021,2359 Performed at [facility], [address] [place], [place] [address]     Prenatal labs: ABO, Rh: --/--/O POS (02/06 1219) Antibody: NEG (02/06 1219) Rubella: Immune (08/23 0000) RPR: Nonreactive (08/23 0000)  HBsAg: Negative (08/23 0000)  HIV: Non-reactive (08/23 0000)  GBS:     U/S> A Vtx, 4# 5oz, AFI wnl (largest pocket 5.4 cm)       > B Transverse, 5# 5oz, AFI wnl (largest pocket 4cm)  Assessment/Plan: 36 yo G4P1 Twins-Di/Di          -Vtx/Transverse-D/W patient I recommend cesarean section for delivery PPROM-Latency Atb ordered              -BMZ ordered              -NST qshift and prn              -serial CBC MFM consult done- in patient management until delivery                               -consider delivery @ 34 wks or if signs of infection, fetal compromise or labor are noted                               -rescue dose of BMZ @ 33 5/7 and 33 6/7 prior to 34 week delivery Consult to Neonatology has been ordered   Roselle Locus II 04/27/2021, 5:50 PM

## 2021-04-27 NOTE — MAU Note (Signed)
G3P1 with di/di TIUP at 32 weeks.  C/o gush of fluid on Saturday and wetness off and in since.  No ctx, no bleeding and good FM.  No other OB concerns.

## 2021-04-27 NOTE — Consult Note (Addendum)
MFM Note  Denise Love is a 36 year old gravida 4 para 1 who is currently at 32 weeks and 2 days with an IVF dichorionic, diamniotic twin gestation where PPROM of twin A occurred about 2 days ago.  She has continued to leak fluid since rupturing membranes.  The fetal status for both fetuses appear reassuring.  Due to PPROM at her current gestational age, she is receiving a complete course of antenatal corticosteroids and latency antibiotics.  The patient reports that she had a cell free DNA test which indicated a low risk for trisomy 21, 18, and 13 earlier in her pregnancy.  These are predicted to be fraternal/dizygotic twins.  A female and female fetus are noted.  An ultrasound performed this morning shows that twin A is in the vertex presentation.    The EFW for twin A was 4 pounds 5 ounces (42nd percentile).  A maximal vertical pocket of 5.4 cm of amniotic fluid is noted around twin A.  The EFW for twin B was 5 pounds 5 ounces (94th percentile).  A maximal vertical pocket of 4 cm of amniotic fluid is noted around twin B.  Twin B is in the transverse/breech presentation.  She denies any other problems in her current pregnancy.  The usual management and implications of PPROM were discussed with the patient.  She was advised that due to rupture of membranes, she will require inpatient management until delivery with daily fetal testing.   She should be receive a complete course of antenatal corticosteroids and be placed on latency antibiotics for 7 days.  Should she remain stable and showed no signs of an intrauterine infection, delivery may occur at around 34 weeks.    Delivery is indicated at any time should she show any signs or symptoms of an intrauterine infection or spontaneous labor.  Due to the twin gestation, I would recommend that she receive a rescue course (2 shots) of steroids at 33 weeks and 5 days to 33 weeks and 6 days.  A rescue course of steroids would not be recommended  should she require delivery within the next 7 days.  The patient was advised that she may attempt a vaginal delivery as twin A is in the vertex presentation.  However as twin B is in the transverse/breech presentation, a cesarean delivery may also be considered.  Magnesium sulfate for fetal neuroprotection is not necessary at her current gestational age.  The patient and her husband were reassured that delivery at her current gestational age or later is generally associated with a good neonatal outcome.    Her babies will most likely require a NICU admission following delivery.    The couple stated that all of their questions have been answered.

## 2021-04-28 ENCOUNTER — Inpatient Hospital Stay (HOSPITAL_COMMUNITY): Payer: 59 | Admitting: Certified Registered Nurse Anesthetist

## 2021-04-28 ENCOUNTER — Encounter (HOSPITAL_COMMUNITY): Payer: Self-pay | Admitting: Obstetrics and Gynecology

## 2021-04-28 ENCOUNTER — Encounter (HOSPITAL_COMMUNITY)
Admission: AD | Disposition: A | Discharge status: Home / Self Care | Payer: Self-pay | Source: Home / Self Care | Attending: Obstetrics and Gynecology

## 2021-04-28 LAB — CBC
HCT: 31.2 % — ABNORMAL LOW (ref 36.0–46.0)
Hemoglobin: 10.2 g/dL — ABNORMAL LOW (ref 12.0–15.0)
MCH: 28.8 pg (ref 26.0–34.0)
MCHC: 32.7 g/dL (ref 30.0–36.0)
MCV: 88.1 fL (ref 80.0–100.0)
Platelets: 246 10*3/uL (ref 150–400)
RBC: 3.54 MIL/uL — ABNORMAL LOW (ref 3.87–5.11)
RDW: 12.5 % (ref 11.5–15.5)
WBC: 23.3 10*3/uL — ABNORMAL HIGH (ref 4.0–10.5)
nRBC: 0.2 % (ref 0.0–0.2)

## 2021-04-28 SURGERY — Surgical Case
Anesthesia: Spinal

## 2021-04-28 MED ORDER — PROPOFOL 10 MG/ML IV BOLUS
INTRAVENOUS | Status: AC
  Filled 2021-04-28: qty 20

## 2021-04-28 MED ORDER — PHENYLEPHRINE HCL (PRESSORS) 10 MG/ML IV SOLN: INTRAVENOUS | Status: DC | PRN

## 2021-04-28 MED ORDER — KETOROLAC TROMETHAMINE 30 MG/ML IJ SOLN: 30.0000 mg | Freq: Four times a day (QID) | INTRAMUSCULAR | Status: AC | PRN

## 2021-04-28 MED ORDER — OXYTOCIN-SODIUM CHLORIDE 30-0.9 UT/500ML-% IV SOLN
INTRAVENOUS | Status: AC
  Filled 2021-04-28: qty 500

## 2021-04-28 MED ORDER — LACTATED RINGERS IV SOLN: INTRAVENOUS | Status: DC

## 2021-04-28 MED ORDER — OXYCODONE HCL 5 MG PO TABS
5.0000 mg | ORAL_TABLET | ORAL | Status: DC | PRN
  Administered 2021-04-28: 5 mg via ORAL
  Administered 2021-04-29 (×2): 10 mg via ORAL
  Administered 2021-04-29: 5 mg via ORAL
  Administered 2021-04-29 – 2021-04-30 (×6): 10 mg via ORAL
  Administered 2021-05-01 (×2): 5 mg via ORAL
  Administered 2021-05-01: 10 mg via ORAL
  Administered 2021-05-01: 5 mg via ORAL
  Administered 2021-05-02: 10 mg via ORAL
  Administered 2021-05-02: 5 mg via ORAL
  Filled 2021-04-28: qty 2
  Filled 2021-04-28 (×2): qty 1
  Filled 2021-04-28: qty 2
  Filled 2021-04-28 (×2): qty 1
  Filled 2021-04-28 (×8): qty 2
  Filled 2021-04-28 (×2): qty 1

## 2021-04-28 MED ORDER — WITCH HAZEL-GLYCERIN EX PADS: 1.0000 "application " | MEDICATED_PAD | CUTANEOUS | Status: DC | PRN

## 2021-04-28 MED ORDER — TETANUS-DIPHTH-ACELL PERTUSSIS 5-2.5-18.5 LF-MCG/0.5 IM SUSY: 0.5000 mL | PREFILLED_SYRINGE | Freq: Once | INTRAMUSCULAR | Status: DC

## 2021-04-28 MED ORDER — OXYTOCIN-SODIUM CHLORIDE 30-0.9 UT/500ML-% IV SOLN
INTRAVENOUS | Status: DC | PRN
  Administered 2021-04-28: 300 mL via INTRAVENOUS

## 2021-04-28 MED ORDER — MENTHOL 3 MG MT LOZG: 1.0000 | LOZENGE | OROMUCOSAL | Status: DC | PRN

## 2021-04-28 MED ORDER — SIMETHICONE 80 MG PO CHEW: 80.0000 mg | CHEWABLE_TABLET | ORAL | Status: DC | PRN

## 2021-04-28 MED ORDER — DEXAMETHASONE SODIUM PHOSPHATE 10 MG/ML IJ SOLN
INTRAMUSCULAR | Status: AC
  Filled 2021-04-28: qty 1

## 2021-04-28 MED ORDER — COCONUT OIL OIL
1.0000 "application " | TOPICAL_OIL | Status: DC | PRN
  Administered 2021-04-30: 1 via TOPICAL

## 2021-04-28 MED ORDER — ZOLPIDEM TARTRATE 5 MG PO TABS
5.0000 mg | ORAL_TABLET | Freq: Every evening | ORAL | Status: DC | PRN
  Administered 2021-04-30: 5 mg via ORAL
  Filled 2021-04-28: qty 1

## 2021-04-28 MED ORDER — MORPHINE SULFATE (PF) 0.5 MG/ML IJ SOLN
INTRAMUSCULAR | Status: DC | PRN
  Administered 2021-04-28: .15 mg via INTRATHECAL

## 2021-04-28 MED ORDER — ACETAMINOPHEN 10 MG/ML IV SOLN
INTRAVENOUS | Status: DC | PRN
  Administered 2021-04-28: 1000 mg via INTRAVENOUS

## 2021-04-28 MED ORDER — CEFAZOLIN SODIUM-DEXTROSE 2-3 GM-%(50ML) IV SOLR
INTRAVENOUS | Status: DC | PRN
  Administered 2021-04-28: 2 g via INTRAVENOUS

## 2021-04-28 MED ORDER — LIDOCAINE-EPINEPHRINE (PF) 2 %-1:200000 IJ SOLN
INTRAMUSCULAR | Status: AC
  Filled 2021-04-28: qty 20

## 2021-04-28 MED ORDER — OXYTOCIN-SODIUM CHLORIDE 30-0.9 UT/500ML-% IV SOLN: 2.5000 [IU]/h | INTRAVENOUS | Status: AC

## 2021-04-28 MED ORDER — ONDANSETRON HCL 4 MG/2ML IJ SOLN
INTRAMUSCULAR | Status: DC | PRN
  Administered 2021-04-28: 4 mg via INTRAVENOUS

## 2021-04-28 MED ORDER — DIPHENHYDRAMINE HCL 25 MG PO CAPS: 25.0000 mg | ORAL_CAPSULE | ORAL | Status: DC | PRN

## 2021-04-28 MED ORDER — NALOXONE HCL 4 MG/10ML IJ SOLN
1.0000 ug/kg/h | INTRAVENOUS | Status: DC | PRN
  Filled 2021-04-28: qty 5

## 2021-04-28 MED ORDER — DIPHENHYDRAMINE HCL 25 MG PO CAPS
25.0000 mg | ORAL_CAPSULE | Freq: Four times a day (QID) | ORAL | Status: DC | PRN
  Administered 2021-04-28: 25 mg via ORAL
  Filled 2021-04-28: qty 1

## 2021-04-28 MED ORDER — PHENYLEPHRINE 40 MCG/ML (10ML) SYRINGE FOR IV PUSH (FOR BLOOD PRESSURE SUPPORT)
PREFILLED_SYRINGE | INTRAVENOUS | Status: AC
  Filled 2021-04-28: qty 10

## 2021-04-28 MED ORDER — ONDANSETRON HCL 4 MG/2ML IJ SOLN: 4.0000 mg | Freq: Three times a day (TID) | INTRAMUSCULAR | Status: DC | PRN

## 2021-04-28 MED ORDER — BUPIVACAINE IN DEXTROSE 0.75-8.25 % IT SOLN
INTRATHECAL | Status: DC | PRN
  Administered 2021-04-28: 12 mg via INTRATHECAL

## 2021-04-28 MED ORDER — SCOPOLAMINE 1 MG/3DAYS TD PT72
MEDICATED_PATCH | TRANSDERMAL | Status: AC
  Filled 2021-04-28: qty 1

## 2021-04-28 MED ORDER — DIPHENHYDRAMINE HCL 50 MG/ML IJ SOLN: 12.5000 mg | INTRAMUSCULAR | Status: DC | PRN

## 2021-04-28 MED ORDER — PHENYLEPHRINE HCL-NACL 20-0.9 MG/250ML-% IV SOLN
INTRAVENOUS | Status: AC
  Filled 2021-04-28: qty 250

## 2021-04-28 MED ORDER — MORPHINE SULFATE (PF) 0.5 MG/ML IJ SOLN
INTRAMUSCULAR | Status: AC
  Filled 2021-04-28: qty 10

## 2021-04-28 MED ORDER — SIMETHICONE 80 MG PO CHEW
80.0000 mg | CHEWABLE_TABLET | Freq: Three times a day (TID) | ORAL | Status: DC
  Administered 2021-04-28 – 2021-05-02 (×10): 80 mg via ORAL
  Filled 2021-04-28 (×10): qty 1

## 2021-04-28 MED ORDER — FENTANYL CITRATE (PF) 100 MCG/2ML IJ SOLN
INTRAMUSCULAR | Status: DC | PRN
  Administered 2021-04-28: 15 ug via INTRATHECAL

## 2021-04-28 MED ORDER — ONDANSETRON HCL 4 MG/2ML IJ SOLN
INTRAMUSCULAR | Status: AC
  Filled 2021-04-28: qty 2

## 2021-04-28 MED ORDER — HYDROMORPHONE HCL 1 MG/ML IJ SOLN: 0.2000 mg | INTRAMUSCULAR | Status: DC | PRN

## 2021-04-28 MED ORDER — DIBUCAINE (PERIANAL) 1 % EX OINT: 1.0000 "application " | TOPICAL_OINTMENT | CUTANEOUS | Status: DC | PRN

## 2021-04-28 MED ORDER — LACTATED RINGERS IV SOLN: INTRAVENOUS | Status: DC | PRN

## 2021-04-28 MED ORDER — SODIUM CHLORIDE 0.9% FLUSH
3.0000 mL | INTRAVENOUS | Status: DC | PRN
  Administered 2021-04-28: 3 mL via INTRAVENOUS

## 2021-04-28 MED ORDER — PRENATAL MULTIVITAMIN CH
1.0000 | ORAL_TABLET | Freq: Every day | ORAL | Status: DC
  Administered 2021-04-28 – 2021-05-01 (×4): 1 via ORAL
  Filled 2021-04-28 (×4): qty 1

## 2021-04-28 MED ORDER — PHENYLEPHRINE HCL-NACL 20-0.9 MG/250ML-% IV SOLN
INTRAVENOUS | Status: DC | PRN
  Administered 2021-04-28: 60 ug/min via INTRAVENOUS

## 2021-04-28 MED ORDER — PHENYLEPHRINE 40 MCG/ML (10ML) SYRINGE FOR IV PUSH (FOR BLOOD PRESSURE SUPPORT)
PREFILLED_SYRINGE | INTRAVENOUS | Status: DC | PRN
  Administered 2021-04-28 (×3): 80 ug via INTRAVENOUS

## 2021-04-28 MED ORDER — SCOPOLAMINE 1 MG/3DAYS TD PT72
MEDICATED_PATCH | TRANSDERMAL | Status: DC | PRN
  Administered 2021-04-28: 1 via TRANSDERMAL

## 2021-04-28 MED ORDER — DEXAMETHASONE SODIUM PHOSPHATE 10 MG/ML IJ SOLN
INTRAMUSCULAR | Status: DC | PRN
  Administered 2021-04-28: 10 mg via INTRAVENOUS

## 2021-04-28 MED ORDER — NALOXONE HCL 0.4 MG/ML IJ SOLN: 0.4000 mg | INTRAMUSCULAR | Status: DC | PRN

## 2021-04-28 MED ORDER — SENNOSIDES-DOCUSATE SODIUM 8.6-50 MG PO TABS
2.0000 | ORAL_TABLET | Freq: Every day | ORAL | Status: DC
  Administered 2021-04-29 – 2021-05-02 (×4): 2 via ORAL
  Filled 2021-04-28 (×4): qty 2

## 2021-04-28 MED ORDER — FENTANYL CITRATE (PF) 100 MCG/2ML IJ SOLN
INTRAMUSCULAR | Status: AC
  Filled 2021-04-28: qty 2

## 2021-04-28 MED ORDER — IBUPROFEN 600 MG PO TABS
600.0000 mg | ORAL_TABLET | Freq: Four times a day (QID) | ORAL | Status: DC | PRN
  Administered 2021-04-28 – 2021-05-02 (×7): 600 mg via ORAL
  Filled 2021-04-28 (×7): qty 1

## 2021-04-28 MED ORDER — LACTATED RINGERS IV BOLUS
500.0000 mL | Freq: Once | INTRAVENOUS | Status: AC
  Administered 2021-04-28: 500 mL via INTRAVENOUS

## 2021-04-28 MED ORDER — ACETAMINOPHEN 500 MG PO TABS
1000.0000 mg | ORAL_TABLET | Freq: Four times a day (QID) | ORAL | Status: DC
  Administered 2021-04-28 – 2021-05-02 (×15): 1000 mg via ORAL
  Filled 2021-04-28 (×17): qty 2

## 2021-04-28 MED ORDER — ACETAMINOPHEN 10 MG/ML IV SOLN
INTRAVENOUS | Status: AC
  Filled 2021-04-28: qty 100

## 2021-04-28 MED ORDER — SOD CITRATE-CITRIC ACID 500-334 MG/5ML PO SOLN
ORAL | Status: AC
  Administered 2021-04-28: 1 mL
  Filled 2021-04-28: qty 30

## 2021-04-28 SURGICAL SUPPLY — 35 items
BENZOIN TINCTURE PRP APPL 2/3 (GAUZE/BANDAGES/DRESSINGS) ×1 IMPLANT
CHLORAPREP W/TINT 26ML (MISCELLANEOUS) ×2 IMPLANT
CLAMP CORD UMBIL (MISCELLANEOUS) IMPLANT
CLOTH BEACON ORANGE TIMEOUT ST (SAFETY) ×2 IMPLANT
DERMABOND ADVANCED (GAUZE/BANDAGES/DRESSINGS)
DERMABOND ADVANCED .7 DNX12 (GAUZE/BANDAGES/DRESSINGS) IMPLANT
DRSG OPSITE POSTOP 4X10 (GAUZE/BANDAGES/DRESSINGS) ×2 IMPLANT
ELECT REM PT RETURN 9FT ADLT (ELECTROSURGICAL) ×2
ELECTRODE REM PT RTRN 9FT ADLT (ELECTROSURGICAL) ×1 IMPLANT
EXTRACTOR VACUUM M CUP 4 TUBE (SUCTIONS) IMPLANT
GAUZE SPONGE 4X4 12PLY STRL (GAUZE/BANDAGES/DRESSINGS) ×1 IMPLANT
GLOVE BIO SURGEON STRL SZ7.5 (GLOVE) ×2 IMPLANT
GLOVE BIOGEL PI IND STRL 7.0 (GLOVE) ×1 IMPLANT
GLOVE BIOGEL PI INDICATOR 7.0 (GLOVE) ×1
GOWN STRL REUS W/TWL LRG LVL3 (GOWN DISPOSABLE) ×4 IMPLANT
KIT ABG SYR 3ML LUER SLIP (SYRINGE) ×2 IMPLANT
NDL HYPO 25X5/8 SAFETYGLIDE (NEEDLE) ×1 IMPLANT
NEEDLE HYPO 25X5/8 SAFETYGLIDE (NEEDLE) ×2 IMPLANT
NS IRRIG 1000ML POUR BTL (IV SOLUTION) ×2 IMPLANT
PACK C SECTION WH (CUSTOM PROCEDURE TRAY) ×2 IMPLANT
PAD ABD 8X10 STRL (GAUZE/BANDAGES/DRESSINGS) ×1 IMPLANT
PAD OB MATERNITY 4.3X12.25 (PERSONAL CARE ITEMS) ×2 IMPLANT
PENCIL SMOKE EVAC W/HOLSTER (ELECTROSURGICAL) ×2 IMPLANT
STRIP CLOSURE SKIN 1/2X4 (GAUZE/BANDAGES/DRESSINGS) ×1 IMPLANT
SUT MNCRL 0 VIOLET CTX 36 (SUTURE) ×4 IMPLANT
SUT MONOCRYL 0 CTX 36 (SUTURE) ×4
SUT PDS AB 0 CTX 60 (SUTURE) ×2 IMPLANT
SUT PLAIN 0 NONE (SUTURE) IMPLANT
SUT PLAIN 2 0 (SUTURE)
SUT PLAIN 2 0 XLH (SUTURE) IMPLANT
SUT PLAIN ABS 2-0 CT1 27XMFL (SUTURE) IMPLANT
SUT VIC AB 4-0 KS 27 (SUTURE) ×2 IMPLANT
TOWEL OR 17X24 6PK STRL BLUE (TOWEL DISPOSABLE) ×2 IMPLANT
TRAY FOLEY W/BAG SLVR 14FR LF (SET/KITS/TRAYS/PACK) ×2 IMPLANT
WATER STERILE IRR 1000ML POUR (IV SOLUTION) ×2 IMPLANT

## 2021-04-28 NOTE — Lactation Note (Signed)
This note was copied from a baby's chart. Lactation Consultation Note  Patient Name: Denise Love [id number] Date: 04/28/2021 Reason for consult: Initial assessment;NICU baby;Multiple gestation Age:37 hours  LC reviewed pumping and assisted with initiation of pumping. RN to place order for stork pump. Mother may need larger flanges (32ml). LC tubed to OBSC Unit. Family in room, so mother declined hand expression at this time. Hand Expression was reviewed and discussed.   Maternal Data Has patient been taught Hand Expression?: No Does the patient have breastfeeding experience prior to this delivery?: Yes How long did the patient breastfeed?: 2 mo Observed wide spacing of breasts.   Feeding Mother's Current Feeding Choice: Breast Milk   Lactation Tools Discussed/Used Tools: Pump Breast pump type: Double-Electric Breast Pump Pump Education: Setup, frequency, and cleaning Reason for Pumping: NICU  Interventions Interventions: DEBP;Education;"The NICU and Your Baby" book;LC Services brochure;Visual merchandiser education   Consult Status Consult Status: Follow-up Date: 04/28/21 Follow-up type: In-patient   Shalay Carder Robinson-Sullivan 04/28/2021, 9:56 AM

## 2021-04-28 NOTE — Anesthesia Preprocedure Evaluation (Signed)
Anesthesia Evaluation  Patient identified by MRN, date of birth, ID band  Reviewed: Unable to perform ROS - Chart review only  Airway Mallampati: II  TM Distance: >3 FB Neck ROM: Full    Dental no notable dental hx. (+) Teeth Intact, Dental Advisory Given   Pulmonary former smoker,    Pulmonary exam normal breath sounds clear to auscultation       Cardiovascular Normal cardiovascular exam Rhythm:Regular Rate:Normal     Neuro/Psych    GI/Hepatic   Endo/Other    Renal/GU      Musculoskeletal   Abdominal   Peds  Hematology Lab Results      Component                Value               Date                      WBC                      11.7 (H)            04/27/2021                HGB                      12.1                04/27/2021                HCT                      36.0                04/27/2021                MCV                      87.0                04/27/2021                PLT                      228                 04/27/2021              Anesthesia Other Findings NKA  Reproductive/Obstetrics (+) Pregnancy TWINS                             Anesthesia Physical Anesthesia Plan  ASA: 2 and emergent  Anesthesia Plan: Spinal   Post-op Pain Management:    Induction:   PONV Risk Score and Plan: Treatment may vary due to age or medical condition  Airway Management Planned: Natural Airway and Nasal Cannula  Additional Equipment:   Intra-op Plan:   Post-operative Plan:   Informed Consent: I have reviewed the patients History and Physical, chart, labs and discussed the procedure including the risks, benefits and alternatives for the proposed anesthesia with the patient or authorized representative who has indicated his/her understanding and acceptance.     Dental advisory given  Plan Discussed with: CRNA and Anesthesiologist  Anesthesia Plan Comments: (32.3 wk twins  for Stat C?S for Breech)  Anesthesia Quick Evaluation

## 2021-04-28 NOTE — Brief Op Note (Signed)
04/27/2021 - 04/28/2021  7:15 AM  PATIENT:  Denise Love  36 y.o. female  PRE-OPERATIVE DIAGNOSIS:  STAT Cesarean d/t premature rupture of membranes; premature labor; multiple gestation   POST-OPERATIVE DIAGNOSIS:  STAT Cesarean d/t premature rupture of membranes; premature labor; multiple gestation   PROCEDURE:  Procedure(s): STAT CESAREAN SECTION (N/A)  SURGEON:  Surgeon(s) and Role:    * Harold Hedge, MD - Primary  PHYSICIAN ASSISTANT:   ASSISTANTS: none   ANESTHESIA:   spinal  EBL:  550 ml  BLOOD ADMINISTERED:none  DRAINS: Urinary Catheter (Foley)   LOCAL MEDICATIONS USED:  NONE  SPECIMEN:  Source of Specimen:  placenta A & B  DISPOSITION OF SPECIMEN:  PATHOLOGY  COUNTS:  YES  TOURNIQUET:  * No tourniquets in log *  DICTATION: .Other Dictation: Dictation Number [id number]  PLAN OF CARE: Admit to inpatient   PATIENT DISPOSITION:  PACU - hemodynamically stable.   Delay start of Pharmacological VTE agent (>24hrs) due to surgical blood loss or risk of bleeding: not applicable

## 2021-04-28 NOTE — Anesthesia Postprocedure Evaluation (Signed)
Anesthesia Post Note  Patient: Denise Love  Procedure(s) Performed: STAT CESAREAN SECTION     Patient location during evaluation: PACU Anesthesia Type: Spinal Level of consciousness: oriented and awake and alert Pain management: pain level controlled Vital Signs Assessment: post-procedure vital signs reviewed and stable Respiratory status: spontaneous breathing, respiratory function stable and nonlabored ventilation Cardiovascular status: blood pressure returned to baseline and stable Postop Assessment: no headache, no backache and no apparent nausea or vomiting Anesthetic complications: no   No notable events documented.  Last Vitals:  Vitals:   04/28/21 0857 04/28/21 0955  BP: 125/72 121/75  Pulse: 84 82  Resp:  18  Temp: 36.7 C   SpO2: 100% 100%    Last Pain:  Vitals:   04/28/21 0903  TempSrc:   PainSc: 0-No pain   Pain Goal: Patients Stated Pain Goal: 3 (04/27/21 1119)                 Boleslaus Holloway A.

## 2021-04-28 NOTE — Plan of Care (Signed)
Problem: Education: Goal: Knowledge of disease or condition will improve Outcome: Completed/Met Goal: Knowledge of the prescribed therapeutic regimen will improve Outcome: Completed/Met Goal: Individualized Educational Video(s) Outcome: Completed/Met   Problem: Clinical Measurements: Goal: Complications related to the disease process, condition or treatment will be avoided or minimized Outcome: Completed/Met   Problem: Health Behavior/Discharge Planning: Goal: Ability to manage health-related needs will improve Outcome: Completed/Met   Problem: Activity: Goal: Risk for activity intolerance will decrease Outcome: Completed/Met   Problem: Nutrition: Goal: Adequate nutrition will be maintained Outcome: Completed/Met   Problem: Coping: Goal: Level of anxiety will decrease Outcome: Completed/Met   Problem: Elimination: Goal: Will not experience complications related to urinary retention Outcome: Completed/Met   Problem: Education: Goal: Knowledge of condition will improve Outcome: Completed/Met   Problem: Activity: Goal: Ability to tolerate increased activity will improve Outcome: Completed/Met   Problem: Coping: Goal: Ability to identify and utilize available resources and services will improve Outcome: Completed/Met   Problem: Life Cycle: Goal: Chance of risk for complications during the postpartum period will decrease Outcome: Completed/Met   Problem: Role Relationship: Goal: Ability to demonstrate positive interaction with newborn will improve Outcome: Completed/Met

## 2021-04-28 NOTE — Op Note (Signed)
NAME: Denise Love, Denise Love MEDICAL RECORD NO: [id number] ACCOUNT NO: [id number] DATE OF BIRTH: 07-15-1985 FACILITY: [facility] LOCATION: [id number] PHYSICIAN: Guy Sandifer. Arleta Creek, MD  Operative Report   DATE OF PROCEDURE: 04/28/2021  PREOPERATIVE DIAGNOSES:    1.  Twin gestation at 66 and 3/7 weeks. 2.  Preterm premature rupture of membranes. 3.  Vertex transverse position. 4.  Labor.  POSTOPERATIVE DIAGNOSES:    1.  Twin gestation at 30 and 3/7 weeks. 2.  Preterm premature rupture of membranes. 3.  Vertex transverse position. 4.  Labor.  PROCEDURE:  Emergent low transverse cesarean section.  SURGEON:  Guy Sandifer. Henderson Cloud II, MD  ANESTHESIA:  Spinal.  ESTIMATED BLOOD LOSS:  550 mL.  SPECIMENS:  Placenta A and placenta B, both to pathology.  FINDINGS:  Baby A viable female infant.  Apgars, arterial cord pH, birth weight pending.  Baby B viable female infant, Apgars, arterial cord pH, birth weight pending.  INDICATIONS AND CONSENT:  This patient is a 36 year old G4 P1 at 32-3/7 weeks, admitted yesterday with a 2-day history of leaking fluid.  She is admitted to antenatal in stable condition.  She received her first betamethasone shot and was started on  latency antibiotics.  Through the night, she was increasingly uncomfortable with contractions.  When I examined her this morning, she is asking for pain medication and breathing through contractions.  Cervical exam noted a cervix to be 5-6 cm dilated,  paper thin and -2 station.  A recommendation for emergent cesarean section is made.  Procedure was discussed and potential risks were discussed with the patient including but not limited to infection, organ damage, bleeding requiring transfusion of blood  products with HIV and hepatitis acquisition, DVT, PE, and pneumonia.  She states she understands and agrees and consent is signed on the chart.  DESCRIPTION OF PROCEDURE:  The patient was taken to the operating room where she is identified.  After  discussion with the anesthesiologist, she has a spinal anesthetic placed.  Foley catheter was already in place.  Timeout was done.  She is prepped at  the abdomen with ChloraPrep and after 3-minute drying time, she is draped in a sterile fashion.  After testing for adequate spinal anesthesia, skin was entered through a Pfannenstiel incision and dissection was carried out in layers to the peritoneum,  which was entered and extended superiorly and inferiorly.  Vesicouterine peritoneum was taken down cephalad laterally.  Bladder flap developed.  Bladder blade was placed.  Uterus was incised in the low transverse manner.  The uterine cavity was entered  bluntly with a hemostat.  Uterine incision extended bilaterally with fingers.  This enters the inferior margin of the placenta.  A hemostat was used to pierce through the placenta, which was also extended out of the way.  Fluid was clear.  Baby A was  delivered from the vertex position.  Cord was clamped and cut and the baby was handed to waiting pediatrics team.  Baby B is delivered by a rupture of membranes for clear fluid.  The feet were then grasped and the baby was delivered from the double  footling breech position without difficulty.  Cord was again clamped and cut and the baby was handed to waiting pediatrics team.  Placentas are both delivered with massage.  The uterus was exteriorized.  Uterine cavity is clean.  Uterus was closed in 2  running locking imbricating layers of 0 Monocryl suture.  Additional 2-0 chromic figure-of-eights were used in the center of the incision  and good hemostasis was noted.  Tubes and ovaries are normal.  Uterus is returned to the abdomen.  Lavage was  carried out and all returned as clear.  Good hemostasis was again noted.  Anterior peritoneum was closed in running fashion with 0 Monocryl suture, which was also used to reapproximate the pyramidalis muscle in the midline.  Anterior rectus fascia was  closed in a running  fashion with 0 looped PDS.  Subcutaneous layer was closed with interrupted plain and the skin was closed in a subcuticular fashion with 4-0 Vicryl on a Keith needle.  Benzoin and Steri-Strips, honeycomb pressure dressing is applied.   All counts were correct.  The patient was awakened and taken to recovery room in stable condition.   MUK D: 04/28/2021 7:25:22 am T: 04/28/2021 8:35:00 am  JOB: [id number]/ [id number]

## 2021-04-28 NOTE — Progress Notes (Signed)
Patient c/o UCs all night unable to sleep Now UCs very strong, requesting pain medication  Today's Vitals   04/27/21 2349 04/28/21 0245 04/28/21 0437 04/28/21 0524  BP: 122/83     Pulse: 97     Resp: 18     Temp: 97.9 F (36.6 C)   (!) 97.5 F (36.4 C)  TempSrc: Oral   Oral  SpO2: 100%     Weight:      Height:      PainSc:  3  7     Body mass index is 34.46 kg/m.   Cx 5/C/-2  UCs q2-3 min   A/P: 32 3/7 wks         PPROM         Labor         Recommend C/S - D/W risks including infection, organ damage, bleeding/transfusion-HIV/Hep, DVT/PE, pneumonia. She states she understands and agrees.

## 2021-04-28 NOTE — Transfer of Care (Signed)
Immediate Anesthesia Transfer of Care Note  Patient: Denise Love  Procedure(s) Performed: STAT CESAREAN SECTION  Patient Location: PACU  Anesthesia Type:Spinal  Level of Consciousness: awake, alert  and oriented  Airway & Oxygen Therapy: Patient Spontanous Breathing  Post-op Assessment: Report given to RN and Post -op Vital signs reviewed and stable  Post vital signs: Reviewed and stable  Last Vitals:  Vitals Value Taken Time  BP 92/58 04/28/21 0738  Temp    Pulse 100 04/28/21 0739  Resp 20 04/28/21 0739  SpO2 98 % 04/28/21 0739  Vitals shown include unvalidated device data.  Last Pain:  Vitals:   04/28/21 0524  TempSrc: Oral  PainSc:       Patients Stated Pain Goal: 3 ([id number])  Complications: No notable events documented.

## 2021-04-29 ENCOUNTER — Encounter (HOSPITAL_COMMUNITY): Payer: Self-pay | Admitting: Obstetrics and Gynecology

## 2021-04-29 LAB — CBC
HCT: 24.2 % — ABNORMAL LOW (ref 36.0–46.0)
Hemoglobin: 8.1 g/dL — ABNORMAL LOW (ref 12.0–15.0)
MCH: 29.1 pg (ref 26.0–34.0)
MCHC: 33.5 g/dL (ref 30.0–36.0)
MCV: 87.1 fL (ref 80.0–100.0)
Platelets: 193 10*3/uL (ref 150–400)
RBC: 2.78 MIL/uL — ABNORMAL LOW (ref 3.87–5.11)
RDW: 12.6 % (ref 11.5–15.5)
WBC: 19.1 10*3/uL — ABNORMAL HIGH (ref 4.0–10.5)
nRBC: 0.3 % — ABNORMAL HIGH (ref 0.0–0.2)

## 2021-04-29 LAB — RPR: RPR Ser Ql: NONREACTIVE

## 2021-04-29 NOTE — Anesthesia Procedure Notes (Signed)
Spinal  Patient location during procedure: OB Start time: 04/29/2021 5:58 AM End time: 04/28/2021 6:03 AM Reason for block: surgical anesthesia Staffing Performed: anesthesiologist  Anesthesiologist: Trevor Iha, MD Preanesthetic Checklist Completed: patient identified, IV checked, risks and benefits discussed, surgical consent, monitors and equipment checked, pre-op evaluation and timeout performed Spinal Block Patient position: sitting Prep: DuraPrep and site prepped and draped Patient monitoring: heart rate, cardiac monitor, continuous pulse ox and blood pressure Approach: midline Location: L3-4 Injection technique: single-shot Needle Needle type: Pencan  Needle gauge: 24 G Needle length: 10 cm Needle insertion depth: 7 cm Assessment Sensory level: T4 Events: CSF return Additional Notes  1 Attempt (s). Pt tolerated procedure well.

## 2021-04-29 NOTE — Addendum Note (Signed)
Addendum  created 04/29/21 1811 by Trevor Iha, MD   Child order released for a procedure order, Clinical Note Signed, Intraprocedure Blocks edited, SmartForm saved

## 2021-04-29 NOTE — Progress Notes (Signed)
Subjective: Postpartum Day 1: Cesarean Delivery s/p PPROM and PTL with twins.  Patient reports tolerating PO and no problems voiding.  Babies on CPAP in NICU and overall stable.   Objective: Vital signs in last 24 hours: Temp:  [97.7 F (36.5 C)-98.7 F (37.1 C)] 98.7 F (37.1 C) (02/08 0758) Pulse Rate:  [70-85] 85 (02/08 0758) Resp:  [18] 18 (02/08 0758) BP: (115-123)/(64-83) 118/64 (02/08 0758) SpO2:  [100 %] 100 % (02/08 0758)  Physical Exam:  General: alert and cooperative Lochia: appropriate Uterine Fundus: firm Incision: healing well DVT Evaluation: No evidence of DVT seen on physical exam.  Recent Labs    04/28/21 0820 04/29/21 0500  HGB 10.2* 8.1*  HCT 31.2* 24.2*    Assessment/Plan: Status post Cesarean section. Doing well postoperatively.  Continue current care.    Ranae Pila 04/29/2021, 9:11 AM

## 2021-04-30 LAB — SURGICAL PATHOLOGY

## 2021-04-30 NOTE — Lactation Note (Signed)
This note was copied from a baby's chart. Lactation Consultation Note  Patient Name: Denise Love [id number] Date: 04/30/2021 Reason for consult: Follow-up assessment;NICU baby;Multiple gestation;Infant < 6lbs;Preterm <34wks;Other (Comment) (AMA) Age:36 years  Visited with mom of 37 hours old pre-ter NICU twins, she's a P2 and reported (+) breast changes during this pregnancy. Her milk is slowly coming in, she's now getting drops to one teaspoon, praised her for her efforts. She voiced that the # 30 flanges are working out better for her.   Reviewed pumping schedule, lactogenesis II, pumping log, expectations and benefits of premature milk for NICU infants. Mom is hoping pumping will work this time and she develops a good supply unlike with her first baby, he's now 37 y.o.   Maternal Data  Mom's supply is WNL  Feeding Mother's Current Feeding Choice: Breast Milk and Donor Milk  Lactation Tools Discussed/Used Tools: Pump Breast pump type: Double-Electric Breast Pump Pump Education: Setup, frequency, and cleaning;Milk Storage Reason for Pumping: pre-term twins in NICU Pumping frequency: 5-6 times/24 hours Pumped volume: 1 mL (1-5 ml)  Interventions Interventions: Breast feeding basics reviewed;DEBP;Education  Plan of care Encouraged mom to start pumping consistently every 3 hours, at least 8 times/24 hours Breast massage and coconut oil were also encouraged prior pumping  No other support person at this time. All questions and concerns answered, mom to call NICU LC PRN.  Discharge Pump: DEBP;Stork Pump  Consult Status Consult Status: Follow-up Date: 04/30/21 Follow-up type: In-patient   Kathleen Likins Venetia Constable 04/30/2021, 7:42 PM

## 2021-04-30 NOTE — Progress Notes (Signed)
POD # 2  Having still abdominal pain/ gas pain. Hard to walk without discomfort. Babies stable in the NICU  BP 122/77 (BP Location: Right Arm)    Pulse 93    Temp 98.4 F (36.9 C) (Oral)    Resp 18    Ht 5\' 6"  (1.676 m)    Wt 96.8 kg    SpO2 98%    Breastfeeding Unknown    BMI 34.46 kg/m  No results found for this or any previous visit (from the past 24 hour(s)). Abdomen  Soft , slightly gassy Bandage is clean and dry   POD # 2  Doing well Discussed slowly increasing the ambulation

## 2021-04-30 NOTE — Clinical Social Work Maternal (Addendum)
CLINICAL SOCIAL WORK MATERNAL/CHILD NOTE  Patient Details  Name: Denise Love MRN: [id number] Date of Birth: 06/13/1985  Date:  07/07/2021  Clinical Social Worker Initiating Note:  Laurey Arrow Date/Time: Initiated:  04/30/21/0914     Child's Name:  Aneta Mins and Patience Musca   Biological Parents:  Mother, Father   Need for Interpreter:  None   Reason for Referral:  Behavioral Health Concerns (hx of anxiety and depression.)   Address:  [address] [place] [address]    Phone number:  [phone]    Additional phone number: FOB's number is [phone]  Household Members/Support Persons (HM/SP):   Household Member/Support Person 1, Household Member/Support Person 2   HM/SP Name Relationship DOB or Age  HM/SP -1 Alaster Asfaw FOB/Husband 10/27/1984  HM/SP -2 Sharyn Lull son 09/28/16  HM/SP -3        HM/SP -4        HM/SP -5        HM/SP -6        HM/SP -7        HM/SP -8          Natural Supports (not living in the home):  Extended Family, Immediate Family, Parent (Per MOB, FOB's family will also provide supports when needed.)   Professional Supports:     Employment: Animator   Type of Work: Therapist, sports with [employer]   Education:  [place] arranged:    Museum/gallery curator Resources:  Multimedia programmer    Other Resources:      Cultural/Religious Considerations Which May Impact Care:  None reported  Strengths:  Ability to meet basic needs  , Pediatrician chosen, Home prepared for child  , Psychotropic Medications   Psychotropic Medications:  Effexor, Buspar      Pediatrician:    [place]  Pediatrician List:   [place]      Pediatrician Fax Number:    Risk Factors/Current Problems:  Mental Health Concerns     Cognitive State:  Alert  , Insightful  , Linear Thinking     Mood/Affect:  Comfortable   , Interested  , Happy  , Bright  , Relaxed     CSW Assessment: CSW met with MOB at her bedside in room 109. MOB was resting in bed and FOB was in the restroom (FOB did not engage with CSW and remain in the restroom during the entire assessment).  CSW offered to return at a later time and MOB declined.  MOB gave verbal permission for CSW to complete the assessment while FOB was in FOB was present. MOB was polite, easy to engage, and receptive to meeting with CSW. CSW explained CSW role and invited MOB to ask questions.   CSW asked about MOB's MH hx.  MOB openly shared that she was dx with anx/dep about 8 years ago and her symptoms are currently being managed with Buspar and Effexor. MOB denied having any  PMAD symptoms with her oldest child. CSW provided education regarding the baby blues period vs. perinatal mood disorders, discussed treatment and gave resources for mental health follow up if concerns arise.  CSW recommends self-evaluation during the postpartum time period using the New Mom Checklist from Postpartum Progress and encouraged MOB to contact a medical professional if symptoms are noted at any time.  MOB presented with insight and  and did not demonstrate any acute mental health symptoms. CSW assessed for safety and MOB denied SI and HI.  CSW offered resources for outpatient counseling and MOB declined. MOB reported having a good support team and expressed feeling comfortable seeking help if needed.   CSW provided review of Sudden Infant Death Syndrome (SIDS) precautions.    CSW will continue to offer resources and supports to family while twins remain in NICU.    CSW Plan/Description:  Psychosocial Support and Ongoing Assessment of Needs, Sudden Infant Death Syndrome (SIDS) Education, Perinatal Mood and Anxiety Disorder (PMADs) Education, Other Patient/Family Education, Other Information/Referral to [employer], MSW, [employer] Clinical Social  Work [phone]   Dimple Nanas, LCSW 04/30/2021, 9:18 AM

## 2021-05-01 MED ORDER — DOCUSATE SODIUM 100 MG PO CAPS: 100.0000 mg | ORAL_CAPSULE | Freq: Every day | ORAL | Status: DC | PRN

## 2021-05-01 MED ORDER — FERROUS SULFATE 325 (65 FE) MG PO TABS
325.0000 mg | ORAL_TABLET | ORAL | Status: DC
  Administered 2021-05-01: 325 mg via ORAL
  Filled 2021-05-01: qty 1

## 2021-05-01 NOTE — Progress Notes (Signed)
Postpartum Progress Note  Postpartum Day 3 s/p emergency Cesarean section.  Subjective:  Patient reports no overnight events.  She reports well controlled pain, ambulating without difficulty, voiding spontaneously, tolerating PO.  She reports Positive flatus, Negative BM.  Vaginal bleeding is appropriate.  Objective: Blood pressure 113/64, pulse 79, temperature 98.9 F (37.2 C), temperature source Oral, resp. rate 18, height 5\' 6"  (1.676 m), weight 96.8 kg, SpO2 97 %, unknown if currently breastfeeding.  Physical Exam:  General: alert and no distress Lochia: appropriate Uterine Fundus: firm Incision: dressing in place with minimal shadowing DVT Evaluation: No evidence of DVT seen on physical exam.  Recent Labs    04/29/21 0500  HGB 8.1*  HCT 24.2*    Assessment/Plan: Postpartum Day 3, s/p C-section Fever noted overnight, resolved this AM.  Will continue to monitor and repeat CBC in AM.  Acute blood loss anemia - Fe/colace Lactation following, babies in NICU Pending temp and AM CBC, anticipate discharge home tomorrow.    LOS: 4 days   Carlyon Shadow 05/01/2021, 8:56 AM

## 2021-05-01 NOTE — Plan of Care (Signed)
Problem: Safety: Goal: Ability to remain free from injury will improve Outcome: Completed/Met   Problem: Activity: Goal: Will verbalize the importance of balancing activity with adequate rest periods Outcome: Completed/Met

## 2021-05-02 ENCOUNTER — Other Ambulatory Visit: Payer: Self-pay | Admitting: Family Medicine

## 2021-05-02 ENCOUNTER — Ambulatory Visit: Payer: Self-pay

## 2021-05-02 DIAGNOSIS — F339 Major depressive disorder, recurrent, unspecified: Secondary | ICD-10-CM

## 2021-05-02 DIAGNOSIS — F411 Generalized anxiety disorder: Secondary | ICD-10-CM

## 2021-05-02 LAB — CBC WITH DIFFERENTIAL/PLATELET
Abs Immature Granulocytes: 0.35 10*3/uL — ABNORMAL HIGH (ref 0.00–0.07)
Basophils Absolute: 0 10*3/uL (ref 0.0–0.1)
Basophils Relative: 0 %
Eosinophils Absolute: 0.3 10*3/uL (ref 0.0–0.5)
Eosinophils Relative: 2 %
HCT: 23.5 % — ABNORMAL LOW (ref 36.0–46.0)
Hemoglobin: 7.6 g/dL — ABNORMAL LOW (ref 12.0–15.0)
Immature Granulocytes: 2 %
Lymphocytes Relative: 13 %
Lymphs Abs: 2 10*3/uL (ref 0.7–4.0)
MCH: 28.7 pg (ref 26.0–34.0)
MCHC: 32.3 g/dL (ref 30.0–36.0)
MCV: 88.7 fL (ref 80.0–100.0)
Monocytes Absolute: 1 10*3/uL (ref 0.1–1.0)
Monocytes Relative: 7 %
Neutro Abs: 11.6 10*3/uL — ABNORMAL HIGH (ref 1.7–7.7)
Neutrophils Relative %: 76 %
Platelets: 249 10*3/uL (ref 150–400)
RBC: 2.65 MIL/uL — ABNORMAL LOW (ref 3.87–5.11)
RDW: 13.3 % (ref 11.5–15.5)
WBC: 15.3 10*3/uL — ABNORMAL HIGH (ref 4.0–10.5)
nRBC: 0.7 % — ABNORMAL HIGH (ref 0.0–0.2)

## 2021-05-02 MED ORDER — OXYCODONE HCL 5 MG PO TABS: 5.0000 mg | ORAL_TABLET | Freq: Four times a day (QID) | ORAL | 0 refills | Status: DC | PRN

## 2021-05-02 MED ORDER — ACETAMINOPHEN 325 MG PO TABS: 650.0000 mg | ORAL_TABLET | Freq: Four times a day (QID) | ORAL | 0 refills | Status: DC | PRN

## 2021-05-02 MED ORDER — FERROUS SULFATE 325 (65 FE) MG PO TABS: 325.0000 mg | ORAL_TABLET | ORAL | 3 refills | Status: DC

## 2021-05-02 MED ORDER — IBUPROFEN 600 MG PO TABS: 600.0000 mg | ORAL_TABLET | Freq: Four times a day (QID) | ORAL | 0 refills | Status: DC | PRN

## 2021-05-02 MED ORDER — DOCUSATE SODIUM 100 MG PO CAPS: 100.0000 mg | ORAL_CAPSULE | Freq: Every day | ORAL | 0 refills | Status: DC | PRN

## 2021-05-02 NOTE — Lactation Note (Signed)
This note was copied from a baby's chart. Lactation Consultation Note  Patient Name: Denise Love [id number] Date: 05/02/2021 Reason for consult: Follow-up assessment;NICU baby;Multiple gestation;Other (Comment);Preterm <34wks;Infant < 6lbs (AMA) Age:37 days  Visited with mom of 64 days old pre-term NICU twins, she's a P2 and reports that pumping is going well. Mom is going home today. Reviewed discharge education, lactogenesis II/III, pump settings and supply/demand.   Provided a pumping band for mom and also an extra set of # 30 flanges for her stork pump at home.   Maternal Data  Mom's supply is WNL; her milk is slowly coming in.  Feeding Mother's Current Feeding Choice: Breast Milk and Donor Milk  Lactation Tools Discussed/Used Tools: Pump;Flanges Flange Size: 30 Breast pump type: Double-Electric Breast Pump Pump Education: Setup, frequency, and cleaning;Milk Storage Reason for Pumping: pre-term twins in NICU Pumping frequency: 8 times/24 hours Pumped volume: 10 mL (10-15 ml)  Interventions Interventions: Breast feeding basics reviewed;Hand pump;Education  Plan of care Encouraged mom to continue pumping consistently every 3 hours, at least 8 times/24 hours She'll switch her pump settings from initiation to expression mode. She's taking all her pump pieces to baby's room   FOB present and supportive. All questions and concerns answered, parents to call NICU LC PRN.  Discharge Discharge Education: Engorgement and breast care Pump: DEBP;Stork Pump  Consult Status Consult Status: Follow-up Date: 05/02/21 Follow-up type: In-patient   Denise Love 05/02/2021, 10:51 AM

## 2021-05-02 NOTE — Plan of Care (Signed)
Problem: Clinical Measurements: Goal: Ability to maintain clinical measurements within normal limits will improve Outcome: Completed/Met Goal: Will remain free from infection Outcome: Completed/Met Goal: Diagnostic test results will improve Outcome: Completed/Met Goal: Respiratory complications will improve Outcome: Completed/Met Goal: Cardiovascular complication will be avoided Outcome: Completed/Met   Problem: Elimination: Goal: Will not experience complications related to bowel motility Outcome: Completed/Met   Problem: Pain Managment: Goal: General experience of comfort will improve Outcome: Completed/Met   Problem: Skin Integrity: Goal: Risk for impaired skin integrity will decrease Outcome: Completed/Met   Problem: Education: Goal: Individualized Educational Video(s) Outcome: Completed/Met Goal: Individualized Newborn Educational Video(s) Outcome: Completed/Met   Problem: Skin Integrity: Goal: Demonstration of wound healing without infection will improve Outcome: Completed/Met

## 2021-05-02 NOTE — Progress Notes (Signed)
Postpartum Progress Note  Postpartum Day 4 s/p emergency Cesarean section.  Subjective:  Patient reports no overnight events.  She reports well controlled pain, ambulating without difficulty, voiding spontaneously, tolerating PO.  She reports Positive flatus, Negative BM.  Vaginal bleeding is appropriate.  Objective: Blood pressure 123/80, pulse (!) 103, temperature 98 F (36.7 C), temperature source Oral, resp. rate 20, height 5\' 6"  (1.676 m), weight 96.8 kg, SpO2 100 %, unknown if currently breastfeeding.  Physical Exam:  General: alert and no distress Lochia: appropriate Uterine Fundus: firm Incision: dressing in place with minimal shadowing DVT Evaluation: No evidence of DVT seen on physical exam.  Recent Labs    05/02/21 0450  HGB 7.6*  HCT 23.5*    Assessment/Plan: Postpartum Day 4, s/p C-section Afebrile > 24 hrs, WBC repeated this AM and improved 19.1 to 15.3 Acute blood loss anemia - Fe/colace.  No signs/symptoms of anemia.  Lactation following, babies in NICU Anticipate discharge home today.    LOS: 5 days   Carlyon Shadow 05/02/2021, 7:26 AM

## 2021-05-03 ENCOUNTER — Ambulatory Visit: Payer: Self-pay

## 2021-05-03 NOTE — Lactation Note (Signed)
This note was copied from a baby's chart. Lactation Consultation Note  Patient Name: Denise Love [id number] Date: 05/03/2021 Reason for consult: Follow-up assessment;NICU baby;Multiple gestation;Infant < 6lbs;Late-preterm 34-36.6wks;Other (Comment) (AMA) Age:36 years  Visited with mom of 37 23/32 weeks old (adjusted) NICU twins, she reports she's pumping consistently but has not experienced the full onset of lactogenesis II yet. She's already pumping on expression mode, advised to use the hospital grade pump whenever she comes to visit babies. Reviewed some strategies to increase her supply, she understands that it will take at least 5-7 days to see the results of an intervention.  Maternal Data  Mom's supply has not increased by day 5 and is [name] Mother's Current Feeding Choice: Breast Milk and Donor Milk  Lactation Tools Discussed/Used Tools: Pump;Flanges Flange Size: 30 Breast pump type: Double-Electric Breast Pump Pump Education: Setup, frequency, and cleaning;Milk Storage Reason for Pumping: pre-term infant in NICU Pumping frequency: 7 times/24 hours Pumped volume: 15 mL  Interventions Interventions: Breast feeding basics reviewed;DEBP;Education  Plan of care Encouraged mom to continue pumping consistently every 3 hours, at least 8 times/24 hours She'll try power pumping in the AM   GOB present and supportive. All questions and concerns answered, parents to call NICU LC PRN.   Discharge Pump: DEBP;Stork Pump  Consult Status Consult Status: Follow-up Date: 05/03/21 Follow-up type: In-patient   Denise Love 05/03/2021, 6:04 PM

## 2021-05-03 NOTE — Discharge Summary (Signed)
Obstetric Discharge Summary  Denise Love is a 36 y.o. female that presented on 04/27/2021 at [redacted]w[redacted]d for leakage of fluid in the setting of Di-Di twins via IVF.  She was admitted to [facility] specialty Care for PPROM.  After her admission, her pain increased and cervix was dilated to 5 cm and C section was recommended. She delivered a female and female on 04/28/2021 Her postpartum course was uncomplicated except for low grade fever that resolved with tylenol.  She had no further signs of infection. Postoperative anemia was noted however she was not symptomatic. She was started on iron and colace postoperatively. On PPD#4, she reported well controlled pain, spontaneous voiding, ambulating without difficulty, and tolerating PO.  She was stable for discharge home on 05/02/2021 with plans for in-office follow up.  Hemoglobin  Date Value Ref Range Status  05/02/2021 7.6 (L) 12.0 - 15.0 g/dL Final  06/13/2019 13.4 11.1 - 15.9 g/dL Final   HCT  Date Value Ref Range Status  05/02/2021 23.5 (L) 36.0 - 46.0 % Final   Hematocrit  Date Value Ref Range Status  06/13/2019 40.2 34.0 - 46.6 % Final    Physical Exam:  General: alert and no distress Lochia: appropriate Uterine Fundus: firm Incision: healing well DVT Evaluation: No evidence of DVT seen on physical exam.  Discharge Diagnoses: 32w C section for PPROM, labor, Di-Di twins.   Discharge Information: Date: 05/03/2021 Activity: Pelvic rest, as tolerated Diet: routine Medications: Tylenol, motrin, oxycodone, iron sulfate, colace Condition: stable Instructions: Refer to practice specific booklet.  Discussed prior to discharge.  Discharge to: [place] Follow up.   Why: Please follow up for 6 week postpartum visit. Contact information: [address] [address] [phone]                 Newborn Data:   Manning, Avella [id number]  Live born female   Birth Weight: 4 lb 0.9 oz (1840 g) APGAR: 5, 8  Newborn Delivery   Birth date/time: 04/28/2021 06:28:00 Delivery type: C-Section, Low Transverse Trial of labor: No C-section categorization: Primary       [place], Revak [id number]  Live born female  Birth Weight: 6 lb 2.4 oz (2790 g) APGAR: 5, 7  Newborn Delivery   Birth date/time: 04/28/2021 06:30:00 Delivery type: C-Section, Low Transverse Trial of labor: No C-section categorization: Primary      Twins in [place] 05/03/2021, 9:11 AM

## 2021-05-04 ENCOUNTER — Ambulatory Visit: Payer: Self-pay

## 2021-05-04 NOTE — Lactation Note (Signed)
This note was copied from a baby's chart.  NICU Lactation Consultation Note  Patient Name: Denise Love [id number] Date: 05/04/2021 Age:36 years   Subjective Reason for consult: Follow-up assessment Mother continues to pump frequently and is observing increasing volumes. She denies breast pain or difficulty pumping.   Objective Infant data: Mother's Current Feeding Choice: Breast Milk and Donor Milk  Infant feeding assessment Scale for Readiness: 3    Maternal data: [id number]  C-Section, Low Transverse Pumping frequency: q3-4 hours Pumped volume: 30 mL Flange Size: 30  Pump: DEBP, Stork Pump  Assessment Maternal: Milk volume: Normal   Intervention/Plan Interventions: Education  Tools: Pump; Flanges Pump Education: Setup, frequency, and cleaning; Milk Storage  Plan: Consult Status: Follow-up  NICU Follow-up type: Weekly NICU follow up  Mother will continue to pump q3  Elder Negus 05/04/2021, 5:00 PM

## 2021-05-08 ENCOUNTER — Telehealth (HOSPITAL_COMMUNITY): Payer: Self-pay | Admitting: *Deleted

## 2021-05-08 NOTE — Telephone Encounter (Signed)
Mom reports feeling good. Incision healing well. Still sore a little. No concerns about herself at this time. EPDS= 3 (hospital score=3 )  Twin babies are in NICU.  Duffy Rhody, RN 05-08-2021 at 2:00pm

## 2021-05-09 ENCOUNTER — Ambulatory Visit: Payer: Self-pay

## 2021-05-09 NOTE — Lactation Note (Signed)
This note was copied from a baby's chart. Lactation Consultation Note  Patient Name: Denise Love Date: 05/09/2021 Reason for consult: Follow-up assessment;Mother's request;Late-preterm 34-36.6wks;NICU baby;Multiple gestation;Infant < 6lbs Age:37 days   P2 Mom with questions regarding supply.  Mom states she is pumping 6-7 times in 24 hours and producing about 10-15 ml per session.  She is also power pumping once a day producing 45-60 ml.  Mom noted that she has a history of low milk supply with older child.  Discussed continuing to pump at least 8 times in 24 hours, with a power pump in the morning. Galactogogues were discussed mom will check with her doctor to see if she can start taking Moringa or prescription supplement will be more appropriate.   Babies are 34 weeks today and per RN are not scoring enough for the IDF algorithm.    Plan:  Continue pumping at least 8 times in 24 hours Power pump first thing in the morning Will consider adding a galactogogue such as moringa, after checking with MD  Mom knows to call LC if she has any additional questions and lactation will follow up with her in a week.    Maternal Data Does the patient have breastfeeding experience prior to this delivery?: Yes  Feeding Mother's Current Feeding Choice: Breast Milk and Formula  Lactation Tools Discussed/Used Tools: Pump Breast pump type: Double-Electric Breast Pump Reason for Pumping: NICU baby, Mom is pumping to give infant EBM in a bottle Pumping frequency: 6-7 times in 24 hours Pumped volume: 10 mL (10-15 ml, 45-60ml with power pumping)  Interventions Interventions: Education;DEBP  Discharge Pump: DEBP;Personal  Consult Status Consult Status: Follow-up Date: 05/09/21 Follow-up type: In-patient    Danne Harbor 05/09/2021, 3:59 PM

## 2021-05-11 ENCOUNTER — Ambulatory Visit: Payer: Self-pay

## 2021-05-11 NOTE — Lactation Note (Signed)
This note was copied from a baby's chart.  NICU Lactation Consultation Note  Patient Name: Denise Love [id number] Date: 05/11/2021 Age:37 days   Subjective Reason for consult: Follow-up assessment; Weekly NICU follow-up Mother was tearful during my visit. She continues to pump frequently but has not seen an increase in her supply. We reviewed possible reasons for milk insufficiency. We discussed expectations and mother's goals. Mother does not intend to directly bf. She would like to pump until infants leave the hospital. She is aware that her volume may be less than average.   Objective Infant data: Mother's Current Feeding Choice: Breast Milk and Formula  Infant feeding assessment Scale for Readiness: 4    Maternal data: [id number]  C-Section, Low Transverse Significant Breast History:: hx insufficient breast development during puberty  Current breast feeding challenges:: low milk supply  Pumping frequency: 59mL in first pumping of the day Pumped volume: 20 mL  Risk factor for low milk supply:: insufficient glandular tissue: hypoplastic breasts  Pump: DEBP, Personal  Assessment Infant:  Maternal: Milk volume: Low Mother has breast asymmetry and poor breast development. L breast is more pronounced. She will likely produce less milk then expected but will continue to produce as long as breasts are stimulated regularly. She may benefit from a galactagogue containing goats rue.   Intervention/Plan Interventions: Education  Plan: Consult Status: Follow-up  NICU Follow-up type: Weekly NICU follow up   Elder Negus 05/11/2021, 12:36 PM

## 2021-05-14 ENCOUNTER — Other Ambulatory Visit: Payer: Self-pay | Admitting: Family Medicine

## 2021-05-14 DIAGNOSIS — F339 Major depressive disorder, recurrent, unspecified: Secondary | ICD-10-CM

## 2021-05-14 DIAGNOSIS — F411 Generalized anxiety disorder: Secondary | ICD-10-CM

## 2021-05-18 ENCOUNTER — Ambulatory Visit: Payer: Self-pay

## 2021-05-18 NOTE — Lactation Note (Signed)
This note was copied from a baby's chart. Lactation Consultation Note  Patient Name: Marrie Chandra [id number] Date: 05/18/2021 Reason for consult: Mother's request Age:37 wk.o.  Feeding Nipple Type: Nfant Extra Slow Flow (gold)  NICU Lactation Consultation Note  Patient Name: Nathalya Wolanski OIZTI'W Date: 05/18/2021 Age:37 wk.o.   Subjective Reason for consult: Mother's request  Per Mother's request, visited with Mom as she has questions about how to dry up remaining milk supply. Mom stated she is pumping about 4 times a day. LC reviewed strategies for safe lactation cessation.   Objective Infant data: Infant feeding assessment Scale for Readiness: 2 Scale for Quality: 3    Maternal data: [id number]  C-Section, Low Transverse  Pumping frequency: 3-4 times a day   Pump: DEBP, Personal  Assessment Infant:  Maternal: Milk volume: Low   Intervention/Plan Education on lactation cessation.   Plan: Consult Status: Follow-up  1) Continue with current pumping schedule, pumping 4 times a day.  2) Pump off enough milk for comfort.  3) LC will follow up with Mom next week.   NICU Follow-up type: Weekly NICU follow up    Melody Comas 05/18/2021, 1:47 PM Lactation Tools Discussed/Used Pumping frequency: 3-4 times a day  Interventions    Discharge    Consult Status Consult Status: Follow-up Date: 05/18/21    Melody Comas 05/18/2021, 1:47 PM

## 2021-05-26 ENCOUNTER — Other Ambulatory Visit: Payer: Self-pay | Admitting: Family Medicine

## 2021-05-26 DIAGNOSIS — F339 Major depressive disorder, recurrent, unspecified: Secondary | ICD-10-CM

## 2021-05-26 DIAGNOSIS — F411 Generalized anxiety disorder: Secondary | ICD-10-CM

## 2021-05-30 ENCOUNTER — Ambulatory Visit: Payer: Self-pay

## 2021-05-30 ENCOUNTER — Other Ambulatory Visit: Payer: Self-pay | Admitting: Family Medicine

## 2021-05-30 DIAGNOSIS — F411 Generalized anxiety disorder: Secondary | ICD-10-CM

## 2021-05-30 DIAGNOSIS — F339 Major depressive disorder, recurrent, unspecified: Secondary | ICD-10-CM

## 2021-05-30 NOTE — Lactation Note (Signed)
This note was copied from a baby's chart. ?Lactation Consultation Note ?Mother has discontinued pumping and milk production has ceased. Lactation services are complete at this time.  ? ?Patient Name: Denise Love ?Today's Date: 05/30/2021 ?  ?Age:37 wk.o. ? ? ?Gwynne Edinger ?05/30/2021, 2:57 PM ? ? ? ?

## 2021-06-19 ENCOUNTER — Other Ambulatory Visit: Payer: Self-pay | Admitting: Family Medicine

## 2021-06-19 DIAGNOSIS — F339 Major depressive disorder, recurrent, unspecified: Secondary | ICD-10-CM

## 2021-06-19 DIAGNOSIS — F411 Generalized anxiety disorder: Secondary | ICD-10-CM

## 2021-06-19 NOTE — Telephone Encounter (Signed)
Denise Love. NTBS 30 days given 05/14/21. ?

## 2021-06-22 ENCOUNTER — Encounter: Payer: Self-pay | Admitting: Family Medicine

## 2021-06-22 NOTE — Telephone Encounter (Signed)
CALLED PT TO SCHEDULE APPT/LMOVM ?LETTER MAILED ?

## 2021-06-23 ENCOUNTER — Other Ambulatory Visit: Payer: Self-pay | Admitting: Family Medicine

## 2021-06-23 DIAGNOSIS — F339 Major depressive disorder, recurrent, unspecified: Secondary | ICD-10-CM

## 2021-06-23 DIAGNOSIS — F411 Generalized anxiety disorder: Secondary | ICD-10-CM

## 2021-08-05 ENCOUNTER — Encounter: Payer: Self-pay | Admitting: Family Medicine

## 2021-08-05 ENCOUNTER — Ambulatory Visit (INDEPENDENT_AMBULATORY_CARE_PROVIDER_SITE_OTHER): Payer: 59 | Admitting: Family Medicine

## 2021-08-05 VITALS — BP 109/73 | HR 82 | Temp 98.7°F | Ht 66.0 in | Wt 187.0 lb

## 2021-08-05 DIAGNOSIS — F339 Major depressive disorder, recurrent, unspecified: Secondary | ICD-10-CM | POA: Diagnosis not present

## 2021-08-05 DIAGNOSIS — O9081 Anemia of the puerperium: Secondary | ICD-10-CM | POA: Diagnosis not present

## 2021-08-05 DIAGNOSIS — F411 Generalized anxiety disorder: Secondary | ICD-10-CM

## 2021-08-05 MED ORDER — VENLAFAXINE HCL ER 150 MG PO CP24: ORAL_CAPSULE | ORAL | 2 refills | Status: DC

## 2021-08-05 MED ORDER — BUPROPION HCL ER (XL) 300 MG PO TB24: 300.0000 mg | ORAL_TABLET | Freq: Every day | ORAL | 2 refills | Status: DC

## 2021-08-05 NOTE — Progress Notes (Signed)
?  ? ?Subjective:  ?Patient ID: Denise Love, female    DOB: 08-13-1985, 36 y.o.   MRN: [id number] ? ?Patient Care Team: ?Sonny Mastersakes, Tomorrow Dehaas M, FNP as PCP - General (Family Medicine)  ? ?Chief Complaint:  Anxiety and Depression ? ? ?HPI: ?Denise FreshwaterLauren Cormany is a 36 y.o. female presenting on 08/05/2021 for Anxiety and Depression ? ?Pt presents today for anxiety and depression medication refill.  ?She recently had a C-Section and was anemic postpartum. Last Hgb 10.1. She is not on iron repletion therapy. Denies chest pain, shortness of breath, palpitations, headaches or dizziness. ?She reports her anxiety and depression are well controlled on current regimen. No reports adverse reactions to medications. Not breast feeding. No SI or HI.  ? ?  08/05/2021  ? 10:03 AM 12/16/2020  ?  1:39 PM 06/13/2019  ?  8:37 AM 01/19/2017  ?  3:04 PM 11/27/2015  ?  3:26 PM  ?Depression screen PHQ 2/9  ?Decreased Interest 0 0 1 0 0  ?Down, Depressed, Hopeless 0 0 1 0 0  ?PHQ - 2 Score 0 0 2 0 0  ?Altered sleeping 0 0 1    ?Tired, decreased energy 1 1 1     ?Change in appetite 0 0 0    ?Feeling bad or failure about yourself  0 0 0    ?Trouble concentrating 0 0 0    ?Moving slowly or fidgety/restless 0 0 0    ?Suicidal thoughts 0 0 0    ?PHQ-9 Score 1 1 4     ?Difficult doing work/chores Not difficult at all Somewhat difficult Not difficult at all    ? ? ?  08/05/2021  ? 10:04 AM 12/16/2020  ?  1:40 PM 06/13/2019  ?  8:38 AM  ?GAD 7 : Generalized Anxiety Score  ?Nervous, Anxious, on Edge 0 0 1  ?Control/stop worrying 0 0 0  ?Worry too much - different things 0 0 0  ?Trouble relaxing 0 0 1  ?Restless 0 0 0  ?Easily annoyed or irritable 0 0 1  ?Afraid - awful might happen 0 0 0  ?Total GAD 7 Score 0 0 3  ?Anxiety Difficulty  Not difficult at all Somewhat difficult  ? ? ? ?  ? ? ?Relevant past medical, surgical, family, and social history reviewed and updated as indicated.  ?Allergies and medications reviewed and updated. Data reviewed: Chart in Epic. ? ? ?Past  Medical History:  ?Diagnosis Date  ? Anxiety   ? GERD (gastroesophageal reflux disease)   ? ? ?Past Surgical History:  ?Procedure Laterality Date  ? CESAREAN SECTION MULTI-GESTATIONAL N/A 04/28/2021  ? Procedure: MULTI-GESTATIONAL CESAREAN SECTION;  Surgeon: Harold Hedgeomblin, James, MD;  Location: [facility] ORS;  Service: Obstetrics;  Laterality: N/A;  ? DILATION AND CURETTAGE, DIAGNOSTIC / THERAPEUTIC    ? ? ?Social History  ? ?Socioeconomic History  ? Marital status: Married  ?  Spouse name: Not on file  ? Number of children: Not on file  ? Years of education: Not on file  ? Highest education level: Not on file  ?Occupational History  ? Not on file  ?Tobacco Use  ? Smoking status: Former  ?  Packs/day: 0.25  ?  Types: Cigarettes  ? Smokeless tobacco: Never  ?Vaping Use  ? Vaping Use: Never used  ?Substance and Sexual Activity  ? Alcohol use: Not Currently  ?  Alcohol/week: 6.0 standard drinks  ?  Types: 6 Cans of beer per week  ? Drug use: No  ?  Sexual activity: Yes  ?Other Topics Concern  ? Not on file  ?Social History Narrative  ? Not on file  ? ?Social Determinants of Health  ? ?Financial Resource Strain: Not on file  ?Food Insecurity: Not on file  ?Transportation Needs: Not on file  ?Physical Activity: Not on file  ?Stress: Not on file  ?Social Connections: Not on file  ?Intimate Partner Violence: Not on file  ? ? ?Outpatient Encounter Medications as of 08/05/2021  ?Medication Sig  ? [DISCONTINUED] buPROPion (WELLBUTRIN XL) 300 MG 24 hr tablet Take 1 tablet (300 mg total) by mouth daily. (NEEDS TO BE SEEN BEFORE NEXT REFILL)  ? [DISCONTINUED] venlafaxine XR (EFFEXOR-XR) 150 MG 24 hr capsule TAKE 1 CAPSULE DAILY WITH BREAKFAST (NEEDS TO BE SEEN BEFORE NEXT REFILL)  ? buPROPion (WELLBUTRIN XL) 300 MG 24 hr tablet Take 1 tablet (300 mg total) by mouth daily. (NEEDS TO BE SEEN BEFORE NEXT REFILL)  ? venlafaxine XR (EFFEXOR-XR) 150 MG 24 hr capsule TAKE 1 CAPSULE DAILY WITH BREAKFAST (NEEDS TO BE SEEN BEFORE NEXT REFILL)  ?  [DISCONTINUED] acetaminophen (TYLENOL) 325 MG tablet Take 2 tablets (650 mg total) by mouth every 6 (six) hours as needed.  ? [DISCONTINUED] docusate sodium (COLACE) 100 MG capsule Take 1 capsule (100 mg total) by mouth daily as needed for mild constipation.  ? [DISCONTINUED] ferrous sulfate 325 (65 FE) MG tablet Take 1 tablet (325 mg total) by mouth every other day.  ? [DISCONTINUED] folic acid (FOLVITE) 1 MG tablet Take 1 mg by mouth daily.  ? [DISCONTINUED] ibuprofen (ADVIL) 600 MG tablet Take 1 tablet (600 mg total) by mouth every 6 (six) hours as needed for moderate pain.  ? [DISCONTINUED] oxyCODONE (OXY IR/ROXICODONE) 5 MG immediate release tablet Take 1 tablet (5 mg total) by mouth every 6 (six) hours as needed for moderate pain.  ? [DISCONTINUED] Prenatal Vit-Fe Fumarate-FA (PRENATAL MULTIVITAMIN) TABS tablet Take 1 tablet by mouth daily at 12 noon.  ? ?No facility-administered encounter medications on file as of 08/05/2021.  ? ? ?No Known Allergies ? ?Review of Systems  ?Constitutional:  Positive for fatigue. Negative for activity change, appetite change, chills, diaphoresis, fever and unexpected weight change.  ?HENT: Negative.    ?Eyes: Negative.  Negative for photophobia and visual disturbance.  ?Respiratory:  Negative for cough, chest tightness and shortness of breath.   ?Cardiovascular:  Negative for chest pain, palpitations and leg swelling.  ?Gastrointestinal:  Negative for abdominal pain, blood in stool, constipation, diarrhea, nausea and vomiting.  ?Endocrine: Negative.   ?Genitourinary:  Negative for decreased urine volume, difficulty urinating, dysuria, frequency and urgency.  ?Musculoskeletal:  Negative for arthralgias and myalgias.  ?Skin: Negative.   ?Allergic/Immunologic: Negative.   ?Neurological:  Negative for dizziness, tremors, seizures, syncope, facial asymmetry, speech difficulty, weakness, light-headedness, numbness and headaches.  ?Hematological: Negative.   ?Psychiatric/Behavioral:   Negative for confusion, hallucinations, sleep disturbance and suicidal ideas.   ?All other systems reviewed and are negative. ? ?   ? ?Objective:  ?BP 109/73   Pulse 82   Temp 98.7 ?F (37.1 ?C)   Ht 5\' 6"  (1.676 m)   Wt 187 lb (84.8 kg)   LMP 07/16/2021 (Approximate)   SpO2 96%   BMI 30.18 kg/m?   ? ?Wt Readings from Last 3 Encounters:  ?08/05/21 187 lb (84.8 kg)  ?04/27/21 213 lb 8 oz (96.8 kg)  ?12/16/20 178 lb (80.7 kg)  ? ? ?Physical Exam ?Vitals and nursing note reviewed.  ?Constitutional:   ?  General: She is not in acute distress. ?   Appearance: Normal appearance. She is not ill-appearing, toxic-appearing or diaphoretic.  ?HENT:  ?   Head: Normocephalic and atraumatic.  ?Eyes:  ?   Pupils: Pupils are equal, round, and reactive to light.  ?Cardiovascular:  ?   Rate and Rhythm: Normal rate and regular rhythm.  ?   Heart sounds: Normal heart sounds.  ?Pulmonary:  ?   Effort: Pulmonary effort is normal.  ?   Breath sounds: Normal breath sounds.  ?Musculoskeletal:  ?   Right lower leg: No edema.  ?   Left lower leg: No edema.  ?Skin: ?   General: Skin is warm and dry.  ?   Capillary Refill: Capillary refill takes less than 2 seconds.  ?Neurological:  ?   General: No focal deficit present.  ?   Mental Status: She is alert and oriented to person, place, and time.  ?Psychiatric:     ?   Mood and Affect: Mood normal.     ?   Behavior: Behavior normal.     ?   Thought Content: Thought content normal.     ?   Judgment: Judgment normal.  ? ? ?Results for orders placed or performed during the hospital encounter of 04/27/21  ?Wet prep, genital  ? Specimen: Genital  ?Result Value Ref Range  ? Yeast Wet Prep HPF POC NONE SEEN NONE SEEN  ? Trich, Wet Prep NONE SEEN NONE SEEN  ? Clue Cells Wet Prep HPF POC PRESENT (A) NONE SEEN  ? WBC, Wet Prep HPF POC >=10 (A) <10  ? Sperm NONE SEEN   ?Resp Panel by RT-PCR (Flu A&B, Covid) Nasopharyngeal Swab  ? Specimen: Nasopharyngeal Swab; Nasopharyngeal(NP) swabs in vial  transport medium  ?Result Value Ref Range  ? SARS Coronavirus 2 by RT PCR NEGATIVE NEGATIVE  ? Influenza A by PCR NEGATIVE NEGATIVE  ? Influenza B by PCR NEGATIVE NEGATIVE  ?Urinalysis, Routine w reflex microscopic Urine,

## 2021-08-06 LAB — ANEMIA PROFILE B
Basophils Absolute: 0 10*3/uL (ref 0.0–0.2)
Basos: 1 %
EOS (ABSOLUTE): 0.1 10*3/uL (ref 0.0–0.4)
Eos: 2 %
Ferritin: 9 ng/mL — ABNORMAL LOW (ref 15–150)
Folate: 9.3 ng/mL (ref >3.0)
Hematocrit: 36.9 % (ref 34.0–46.6)
Hemoglobin: 11.7 g/dL (ref 11.1–15.9)
Immature Grans (Abs): 0 10*3/uL (ref 0.0–0.1)
Immature Granulocytes: 0 %
Iron Saturation: 9 % — CL (ref 15–55)
Iron: 36 ug/dL (ref 27–159)
Lymphocytes Absolute: 2.3 10*3/uL (ref 0.7–3.1)
Lymphs: 42 %
MCH: 25.8 pg — ABNORMAL LOW (ref 26.6–33.0)
MCHC: 31.7 g/dL (ref 31.5–35.7)
MCV: 82 fL (ref 79–97)
Monocytes Absolute: 0.5 10*3/uL (ref 0.1–0.9)
Monocytes: 10 %
Neutrophils Absolute: 2.4 10*3/uL (ref 1.4–7.0)
Neutrophils: 45 %
Platelets: 316 10*3/uL (ref 150–450)
RBC: 4.53 x10E6/uL (ref 3.77–5.28)
RDW: 13.8 % (ref 11.7–15.4)
Retic Ct Pct: 1.2 % (ref 0.6–2.6)
Total Iron Binding Capacity: 397 ug/dL (ref 250–450)
UIBC: 361 ug/dL (ref 131–425)
Vitamin B-12: 293 pg/mL (ref 232–1245)
WBC: 5.3 10*3/uL (ref 3.4–10.8)

## 2021-08-06 LAB — THYROID PANEL WITH TSH
Free Thyroxine Index: 1.4 (ref 1.2–4.9)
T3 Uptake Ratio: 26 % (ref 24–39)
T4, Total: 5.3 ug/dL (ref 4.5–12.0)
TSH: 1.63 u[IU]/mL (ref 0.450–4.500)

## 2021-08-06 MED ORDER — IRON (FERROUS SULFATE) 325 (65 FE) MG PO TABS
325.0000 mg | ORAL_TABLET | Freq: Every day | ORAL | 3 refills | Status: DC
Start: 2021-08-06 — End: 2022-11-09

## 2021-08-06 NOTE — Addendum Note (Signed)
Addended by: Sonny Masters on: 08/06/2021 07:50 AM   Modules accepted: Orders

## 2021-10-16 ENCOUNTER — Other Ambulatory Visit: Payer: Self-pay | Admitting: *Deleted

## 2021-10-16 DIAGNOSIS — F411 Generalized anxiety disorder: Secondary | ICD-10-CM

## 2021-10-16 DIAGNOSIS — F339 Major depressive disorder, recurrent, unspecified: Secondary | ICD-10-CM

## 2021-11-15 ENCOUNTER — Telehealth: Payer: Self-pay | Admitting: Physician Assistant

## 2021-11-15 DIAGNOSIS — R3989 Other symptoms and signs involving the genitourinary system: Secondary | ICD-10-CM

## 2021-11-15 MED ORDER — CEPHALEXIN 500 MG PO CAPS: 500.0000 mg | ORAL_CAPSULE | Freq: Two times a day (BID) | ORAL | 0 refills | Status: DC

## 2021-11-15 NOTE — Progress Notes (Signed)

## 2021-12-14 ENCOUNTER — Encounter: Payer: Self-pay | Admitting: Family Medicine

## 2021-12-14 ENCOUNTER — Other Ambulatory Visit: Payer: Self-pay | Admitting: *Deleted

## 2021-12-14 DIAGNOSIS — F411 Generalized anxiety disorder: Secondary | ICD-10-CM

## 2021-12-14 DIAGNOSIS — F339 Major depressive disorder, recurrent, unspecified: Secondary | ICD-10-CM

## 2022-05-20 ENCOUNTER — Encounter: Payer: Self-pay | Admitting: Family Medicine

## 2022-07-08 ENCOUNTER — Other Ambulatory Visit: Payer: Self-pay | Admitting: Family Medicine

## 2022-07-08 DIAGNOSIS — F411 Generalized anxiety disorder: Secondary | ICD-10-CM

## 2022-07-08 DIAGNOSIS — F339 Major depressive disorder, recurrent, unspecified: Secondary | ICD-10-CM

## 2022-08-10 ENCOUNTER — Encounter: Payer: 59 | Admitting: Family Medicine

## 2022-08-27 IMAGING — US US MFM OB DETAIL EACH ADDL GEST+14 WK
1 series · 14 of 28 positions shown · non-contrast
Comparison: none

[Series 1: us mfm ob detail each addl gest+14 wk · 47 acquisitions, 14 frames shown]
[im 2/47]
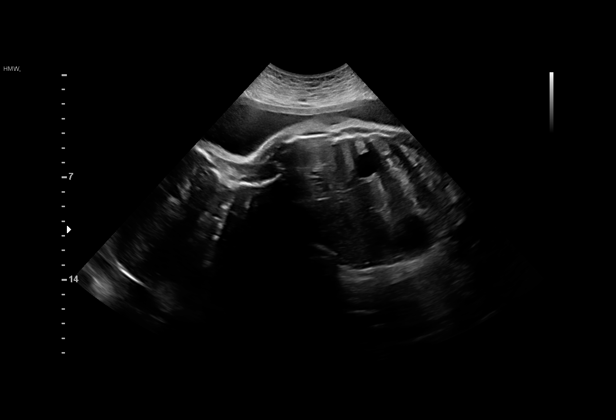
[im 6/47]
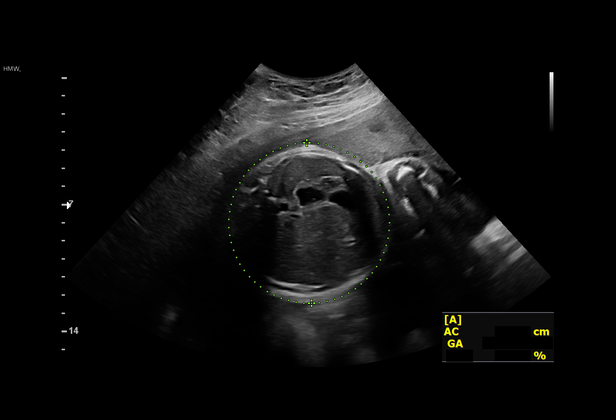
[im 9/47]
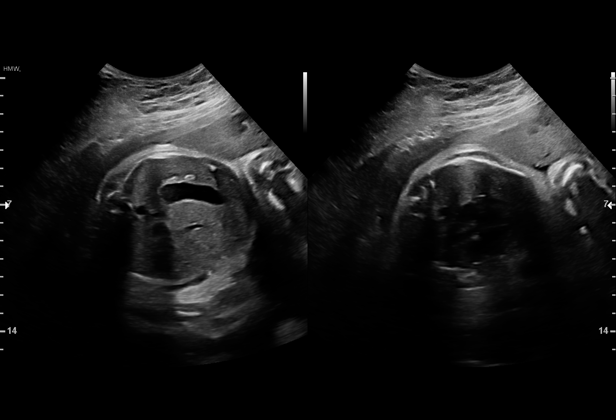
[im 12/47]
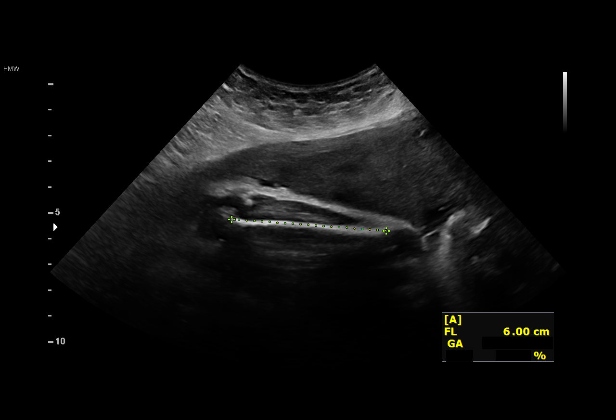
[im 16/47]
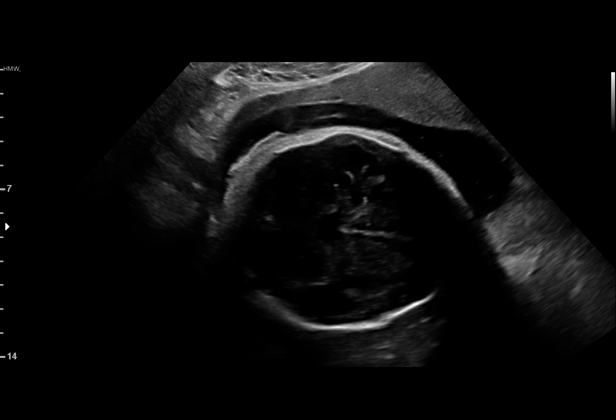
[im 19/47]
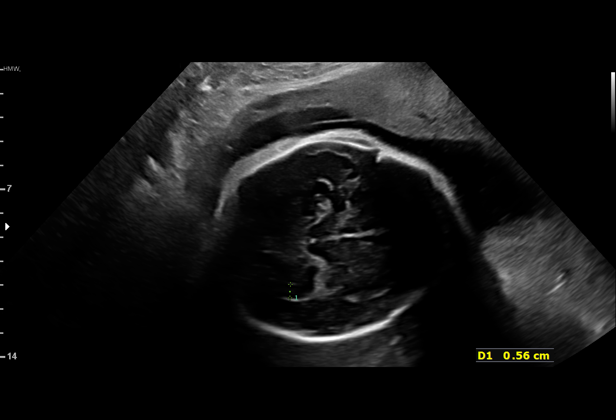
[im 23/47]
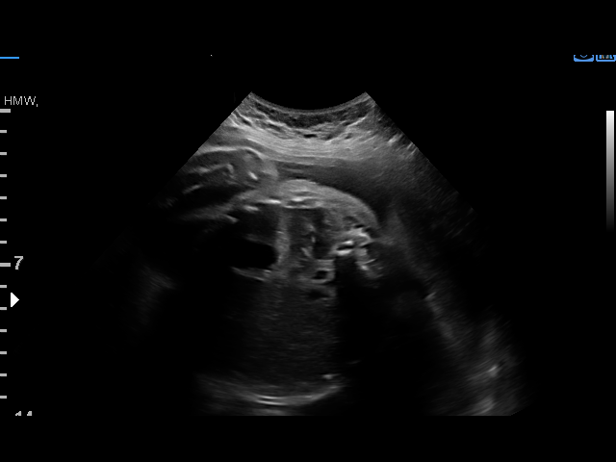
[im 26/47]
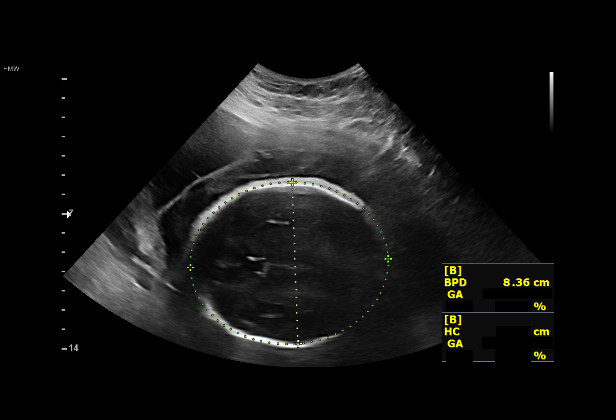
[im 29/47]
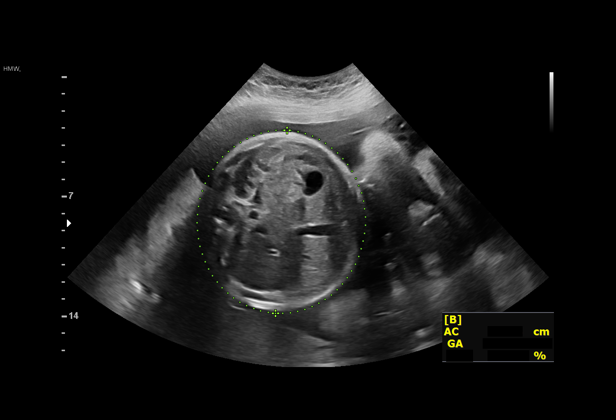
[im 33/47]
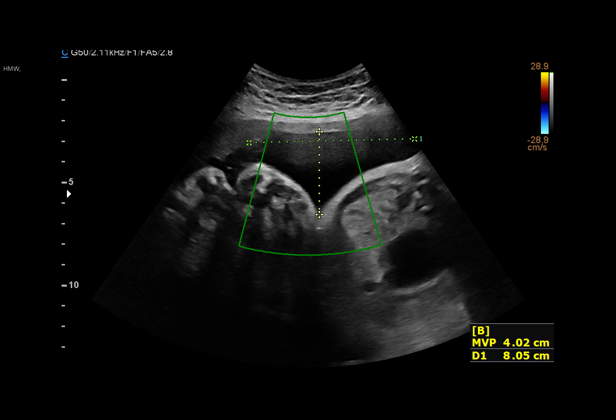
[im 36/47]
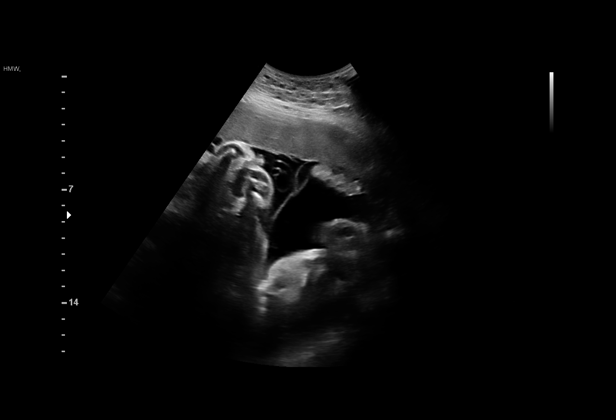
[im 40/47]
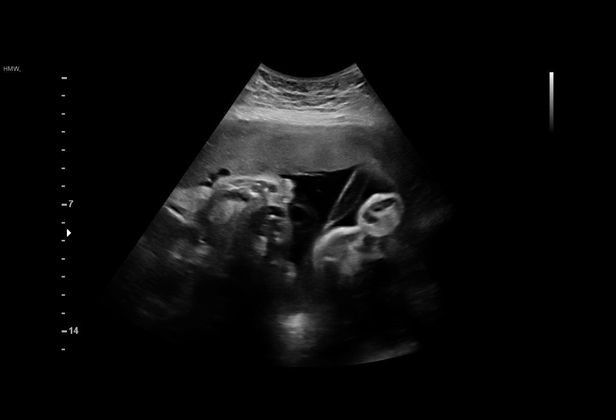
[im 43/47]
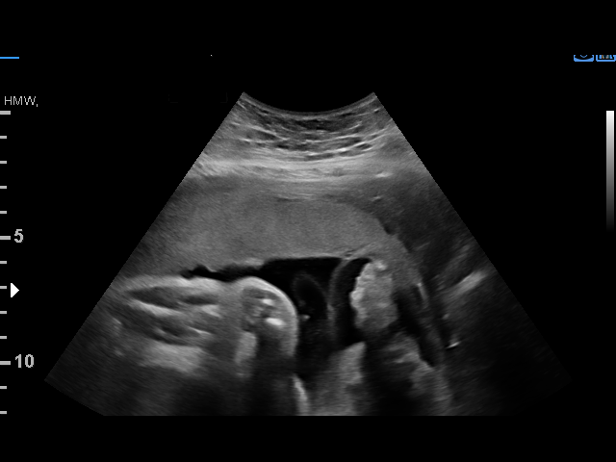
[im 47/47]
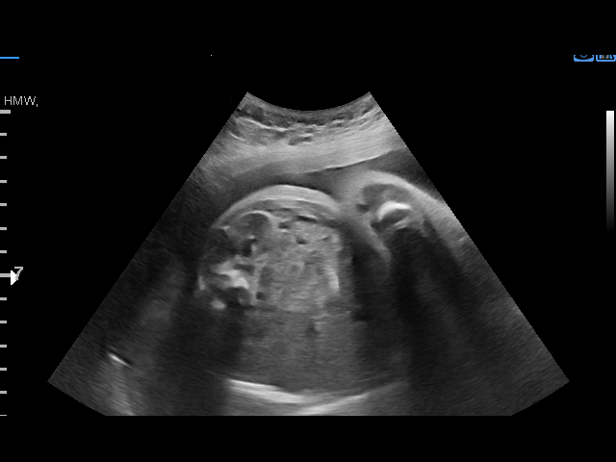

[14 of 28 positions shown; findings below may reference images not displayed]

+14 WK

Indications

 Premature rupture of membranes - leaking
 fluid
 Twin pregnancy, di/di, third trimester
 32 weeks gestation of pregnancy
 Encounter for antenatal screening,
 unspecified
 Obesity complicating pregnancy, third
 trimester
Fetal Evaluation (Fetus A)

 Num Of Fetuses:          2
 Fetal Heart Rate(bpm):   137
 Cardiac Activity:        Observed
 Fetal Lie:               Lower Fetus
 Presentation:            Cephalic
 Placenta:                Anterior
 P. Cord Insertion:       Not well visualized
 Membrane Desc:      Dividing Membrane seen

 Amniotic Fluid
 AFI FV:      Within normal limits

                             Largest Pocket(cm)

Biometry (Fetus A)

 BPD:      81.6  mm     G. Age:  32w 6d         57  %    CI:        74.97   %    70 - 86
                                                         FL/HC:       20.6  %    19.1 -
 HC:        299  mm     G. Age:  33w 1d         34  %    HC/AC:       1.06       0.96 -
 AC:      283.4  mm     G. Age:  32w 3d         52  %    FL/BPD:      75.6  %    71 - 87
 FL:       61.7  mm     G. Age:  32w 0d         29  %    FL/AC:       21.8  %    20 - 24
 Est. FW:    1015   gm     4 lb 5 oz     42  %     FW Discordancy        18  %
OB History

 Gravidity:    3         Term:   0        Prem:   1        SAB:   0
 TOP:          0       Ectopic:  0        Living: 1
Gestational Age (Fetus A)

 Clinical EDD:  32w 2d                                        EDD:   06/20/21
 U/S Today:     32w 4d                                        EDD:   06/18/21
 Best:          32w 2d     Det. By:  Clinical EDD             EDD:   06/20/21
Anatomy (Fetus A)

 Cranium:               Appears normal         LVOT:                   Not well visualized
 Cavum:                 Appears normal         Aortic Arch:            Not well visualized
 Ventricles:            Appears normal         Ductal Arch:            Not well visualized
 Choroid Plexus:        Not well visualized    Diaphragm:              Not well visualized
 Cerebellum:            Not well visualized    Stomach:                Appears normal, left
                                                                       sided
 Posterior Fossa:       Not well visualized    Abdomen:                Appears normal
 Nuchal Fold:           Not well visualized    Abdominal Wall:         Not well visualized
 Face:                  Not well visualized    Cord Vessels:           Not well visualized
 Lips:                  Not well visualized    Kidneys:                Not well visualized
 Palate:                Not well visualized    Bladder:                Appears normal
 Thoracic:              Appears normal         Spine:                  Not well visualized
 Heart:                 Not well visualized    Upper Extremities:      Not well visualized
 RVOT:                  Not well visualized    Lower Extremities:      Not well visualized

 Other:  Technically difficult due to advanced GA and fetal position.

Fetal Evaluation (Fetus B)

 Num Of Fetuses:          2
 Fetal Heart Rate(bpm):   145
 Cardiac Activity:        Observed
 Fetal Lie:               Upper Fetus
 Presentation:            Transverse, head to maternal right
 Placenta:                Anterior
 P. Cord Insertion:       Visualized
 Membrane Desc:      Dividing Membrane seen

 Amniotic Fluid
 AFI FV:      Within normal limits

                             Largest Pocket(cm)
                             4
Biometry (Fetus B)

 BPD:        83  mm     G. Age:  33w 3d         74  %    CI:          79.8  %    70 - 86
                                                         FL/HC:       20.6  %    19.1 -
 HC:      293.6  mm     G. Age:  32w 3d         17  %    HC/AC:       0.91       0.96 -
 AC:      323.7  mm     G. Age:  36w 2d       > 99  %    FL/BPD:      72.9  %    71 - 87
 FL:       60.5  mm     G. Age:  31w 3d         17  %    FL/AC:       18.7  %    20 - 24

 Est. FW:    0150   gm     5 lb 5 oz     94  %     FW Discordancy     0 \ 18 %
Gestational Age (Fetus B)
 Clinical EDD:  32w 2d                                        EDD:   06/20/21
 U/S Today:     33w 3d                                        EDD:   06/12/21
 Best:          32w 2d     Det. By:  Clinical EDD             EDD:   06/20/21
Anatomy (Fetus B)

 Cranium:               Appears normal         LVOT:                   Not well visualized
 Cavum:                 Appears normal         Aortic Arch:            Not well visualized
 Ventricles:            Not well visualized    Ductal Arch:            Not well visualized
 Choroid Plexus:        Not well visualized    Diaphragm:              Not well visualized
 Cerebellum:            Not well visualized    Stomach:                Appears normal, left
                                                                       sided
 Posterior Fossa:       Not well visualized    Abdomen:                Appears normal
 Nuchal Fold:           Not well visualized    Abdominal Wall:         Not well visualized
 Face:                  Not well visualized    Cord Vessels:           Not well visualized
 Lips:                  Not well visualized    Kidneys:                Not well visualized
 Palate:                Not well visualized    Bladder:                Appears normal
 Thoracic:              Appears normal         Spine:                  Not well visualized
 Heart:                 Not well visualized    Upper Extremities:      Not well visualized
 RVOT:                  Not well visualized    Lower Extremities:      Not well visualized

 Other:  Technically difficult due to advanced GA and fetal position.
Cervix Uterus Adnexa

 Cervix
 Not visualized (advanced GA >16wks)

 Uterus
 No abnormality visualized.

 Right Ovary
 No adnexal mass visualized.

 Left Ovary
 No adnexal mass visualized.

 Cul De Sac
 No free fluid seen.

 Adnexa
 No abnormality visualized.
Comments

 MFM Note

 Remi Martz is a 35-year-old gravida 4 para 1 who is
 currently at 32 weeks and 2 days with an IVF dichorionic,
 diamniotic twin gestation where PPROM of twin A occurred
 about 2 days ago.  She has continued to leak fluid since
 rupturing membranes.  The fetal status for both fetuses
 appear reassuring.

 Due to PPROM at her current gestational age, she is
 receiving a complete course of antenatal corticosteroids and
 latency antibiotics.

 The patient reports that she had a cell free DNA test which
 indicated a low risk for trisomy 21, 18, and 13 earlier in her
 pregnancy.  These are predicted to be fraternal/dizygotic
 twins.  A male and female fetus are noted.

 An ultrasound performed this morning shows that twin A is in
 the vertex presentation.

 The EFW for twin A was 4 pounds 5 ounces (42nd
 percentile).  A maximal vertical pocket of 5.4 cm of amniotic
 fluid is noted around twin A.

 The EFW for twin B was 5 pounds 5 ounces (94th percentile).
 A maximal vertical pocket of 4 cm of amniotic fluid is noted
 around twin B.  Twin B is in the transverse/breech
 presentation.

 She denies any other problems in her current pregnancy.

 The usual management and implications of PPROM were
 discussed with the patient.  She was advised that due to
 rupture of membranes, she will require inpatient management
 until delivery with daily fetal testing.

 She should be receive a complete course of antenatal
 corticosteroids and be placed on latency antibiotics for 7 days.

 Should she remain stable and showed no signs of an
 intrauterine infection, delivery may occur at around 34 weeks.

 Delivery is indicated at any time should she show any signs
 or symptoms of an intrauterine infection or spontaneous labor.

 Due to the twin gestation, I would recommend that she
 receive a rescue course (2 shots) of steroids at 33 weeks and
 5 days to 33 weeks and 6 days.

 A rescue course of steroids would not be recommended
 should she require delivery within the next 7 days.

 The patient was advised that she may attempt a vaginal
 delivery as twin A is in the vertex presentation.  However as
 twin B is in the transverse/breech presentation, a cesarean
 delivery may also be considered.

 Magnesium sulfate for fetal neuroprotection is not necessary
 at her current gestational age.

 The patient and her husband were reassured that delivery at
 her current gestational age or later is generally associated
 with a good neonatal outcome.

 Her babies will most likely require a NICU admission
 following delivery.

 The couple stated that all of their questions have been
 answered.

## 2022-11-09 ENCOUNTER — Ambulatory Visit (INDEPENDENT_AMBULATORY_CARE_PROVIDER_SITE_OTHER): Payer: 59 | Admitting: Family Medicine

## 2022-11-09 ENCOUNTER — Encounter: Payer: Self-pay | Admitting: Family Medicine

## 2022-11-09 VITALS — BP 101/66 | HR 69 | Temp 97.7°F | Ht 66.0 in | Wt 178.0 lb

## 2022-11-09 DIAGNOSIS — F339 Major depressive disorder, recurrent, unspecified: Secondary | ICD-10-CM

## 2022-11-09 DIAGNOSIS — E559 Vitamin D deficiency, unspecified: Secondary | ICD-10-CM

## 2022-11-09 DIAGNOSIS — Z130 Encounter for screening for diseases of the blood and blood-forming organs and certain disorders involving the immune mechanism: Secondary | ICD-10-CM

## 2022-11-09 DIAGNOSIS — F411 Generalized anxiety disorder: Secondary | ICD-10-CM

## 2022-11-09 DIAGNOSIS — Z1329 Encounter for screening for other suspected endocrine disorder: Secondary | ICD-10-CM

## 2022-11-09 DIAGNOSIS — Z0000 Encounter for general adult medical examination without abnormal findings: Secondary | ICD-10-CM

## 2022-11-09 DIAGNOSIS — Z13220 Encounter for screening for lipoid disorders: Secondary | ICD-10-CM

## 2022-11-09 DIAGNOSIS — Z0001 Encounter for general adult medical examination with abnormal findings: Secondary | ICD-10-CM | POA: Diagnosis not present

## 2022-11-09 MED ORDER — BUPROPION HCL ER (XL) 300 MG PO TB24: 300.0000 mg | ORAL_TABLET | Freq: Every day | ORAL | 2 refills | Status: DC

## 2022-11-09 MED ORDER — VENLAFAXINE HCL ER 150 MG PO CP24: ORAL_CAPSULE | ORAL | 2 refills | Status: DC

## 2022-11-09 NOTE — Progress Notes (Signed)
Complete physical exam  Patient: Denise Love   DOB: [birth year]   37 y.o. Female  MRN: [id number]  Subjective:    Chief Complaint  Patient presents with   Annual Exam    Denise Love is a 37 y.o. female who presents today for a complete physical exam. She reports consuming a general diet. The patient has a physically strenuous job, but has no regular exercise apart from work.  She generally feels well. She reports sleeping well. She does not have additional problems to discuss today.    Most recent fall risk assessment:    11/09/2022   10:14 AM  Fall Risk   Falls in the past year? 0     Most recent depression screenings:    11/09/2022   10:14 AM 08/05/2021   10:03 AM  PHQ 2/9 Scores  PHQ - 2 Score 0 0  PHQ- 9 Score 0 1    Vision:Within last year and Dental: No current dental problems and Receives regular dental care  Patient Active Problem List   Diagnosis Date Noted   Psoriasis 07/03/2019   Depression, recurrent (HCC) 06/13/2019   GAD (generalized anxiety disorder) 01/25/2014   Past Medical History:  Diagnosis Date   Anxiety    GERD (gastroesophageal reflux disease)    Past Surgical History:  Procedure Laterality Date   CESAREAN SECTION MULTI-GESTATIONAL N/A 04/28/2021   Procedure: MULTI-GESTATIONAL CESAREAN SECTION;  Surgeon: Harold Hedge, MD;  Location: [facility];  Service: Obstetrics;  Laterality: N/A;   DILATION AND CURETTAGE, DIAGNOSTIC / THERAPEUTIC     Social History   Tobacco Use   Smoking status: Former    Current packs/day: 0.25    Types: Cigarettes   Smokeless tobacco: Never  Vaping Use   Vaping status: Never Used  Substance Use Topics   Alcohol use: Not Currently    Alcohol/week: 6.0 standard drinks of alcohol    Types: 6 Cans of beer per week   Drug use: No   Social History   Socioeconomic History   Marital status: Married    Spouse name: Not on file   Number of children: Not on file   Years of education: Not on file   Highest  education level: Not on file  Occupational History   Not on file  Tobacco Use   Smoking status: Former    Current packs/day: 0.25    Types: Cigarettes   Smokeless tobacco: Never  Vaping Use   Vaping status: Never Used  Substance and Sexual Activity   Alcohol use: Not Currently    Alcohol/week: 6.0 standard drinks of alcohol    Types: 6 Cans of beer per week   Drug use: No   Sexual activity: Yes  Other Topics Concern   Not on file  Social History Narrative   Not on file   Social Determinants of Health   Financial Resource Strain: Not on file  Food Insecurity: Not on file  Transportation Needs: Not on file  Physical Activity: Not on file  Stress: Not on file  Social Connections: Not on file  Intimate Partner Violence: Not on file   Family Status  Relation Name Status   Mother  Alive   Father  Alive   Brother  Alive   MGM  (Not Specified)  No partnership data on file   Family History  Problem Relation Age of Onset   Cancer Maternal Grandmother    No Known Allergies    Patient Care Team: Sonny Masters,  FNP as PCP - General (Family Medicine)   Outpatient Medications Prior to Visit  Medication Sig   [DISCONTINUED] buPROPion (WELLBUTRIN XL) 300 MG 24 hr tablet TAKE 1 TABLET BY MOUTH DAILY   [DISCONTINUED] venlafaxine XR (EFFEXOR-XR) 150 MG 24 hr capsule TAKE 1 CAPSULE BY MOUTH DAILY  WITH BREAKFAST   [DISCONTINUED] cephALEXin (KEFLEX) 500 MG capsule Take 1 capsule (500 mg total) by mouth 2 (two) times daily.   [DISCONTINUED] Iron, Ferrous Sulfate, 325 (65 Fe) MG TABS Take 325 mg by mouth daily.   No facility-administered medications prior to visit.    Review of Systems  Constitutional:  Negative for chills, diaphoresis, fever, malaise/fatigue and weight loss.  Cardiovascular:  Negative for chest pain, palpitations, orthopnea, claudication, leg swelling and PND.  All other systems reviewed and are negative.       Objective:     BP 101/66   Pulse 69    Temp 97.7 F (36.5 C) (Temporal)   Ht 5\' 6"  (1.676 m)   Wt 178 lb (80.7 kg)   LMP 10/26/2022   SpO2 98%   BMI 28.73 kg/m  BP Readings from Last 3 Encounters:  11/09/22 101/66  08/05/21 109/73  05/02/21 116/74   Wt Readings from Last 3 Encounters:  11/09/22 178 lb (80.7 kg)  08/05/21 187 lb (84.8 kg)  04/27/21 213 lb 8 oz (96.8 kg)   SpO2 Readings from Last 3 Encounters:  11/09/22 98%  08/05/21 96%  05/02/21 99%      Physical Exam Vitals and nursing note reviewed.  Constitutional:      General: She is not in acute distress.    Appearance: Normal appearance. She is well-developed and well-groomed. She is not ill-appearing, toxic-appearing or diaphoretic.  HENT:     Head: Normocephalic and atraumatic.     Jaw: There is normal jaw occlusion.     Right Ear: Hearing, tympanic membrane, ear canal and external ear normal.     Left Ear: Hearing, tympanic membrane, ear canal and external ear normal.     Nose: Nose normal.     Mouth/Throat:     Lips: Pink.     Mouth: Mucous membranes are moist.     Pharynx: Oropharynx is clear. Uvula midline.  Eyes:     General: Lids are normal.     Extraocular Movements: Extraocular movements intact.     Conjunctiva/sclera: Conjunctivae normal.     Pupils: Pupils are equal, round, and reactive to light.  Neck:     Thyroid: No thyroid mass, thyromegaly or thyroid tenderness.     Vascular: No carotid bruit or JVD.     Trachea: Trachea and phonation normal.  Cardiovascular:     Rate and Rhythm: Normal rate and regular rhythm.     Chest Wall: PMI is not displaced.     Pulses: Normal pulses.     Heart sounds: Normal heart sounds. No murmur heard.    No friction rub. No gallop.  Pulmonary:     Effort: Pulmonary effort is normal. No respiratory distress.     Breath sounds: Normal breath sounds. No wheezing.  Abdominal:     General: Bowel sounds are normal. There is no distension or abdominal bruit.     Palpations: Abdomen is soft. There is  no hepatomegaly or splenomegaly.     Tenderness: There is no abdominal tenderness. There is no right CVA tenderness or left CVA tenderness.     Hernia: No hernia is present.  Genitourinary:    Comments:  Sees GYN Musculoskeletal:        General: Normal range of motion.     Cervical back: Normal range of motion and neck supple.     Right lower leg: No edema.     Left lower leg: No edema.  Lymphadenopathy:     Cervical: No cervical adenopathy.  Skin:    General: Skin is warm and dry.     Capillary Refill: Capillary refill takes less than 2 seconds.     Coloration: Skin is not cyanotic, jaundiced or pale.     Findings: No rash.  Neurological:     General: No focal deficit present.     Mental Status: She is alert and oriented to person, place, and time.     Sensory: Sensation is intact.     Motor: Motor function is intact.     Coordination: Coordination is intact.     Gait: Gait is intact.     Deep Tendon Reflexes: Reflexes are normal and symmetric.  Psychiatric:        Attention and Perception: Attention and perception normal.        Mood and Affect: Mood and affect normal.        Speech: Speech normal.        Behavior: Behavior normal. Behavior is cooperative.        Thought Content: Thought content normal.        Cognition and Memory: Cognition and memory normal.        Judgment: Judgment normal.       Last CBC Lab Results  Component Value Date   WBC 5.3 08/05/2021   HGB 11.7 08/05/2021   HCT 36.9 08/05/2021   MCV 82 08/05/2021   MCH 25.8 (L) 08/05/2021   RDW 13.8 08/05/2021   PLT 316 08/05/2021   Last metabolic panel Lab Results  Component Value Date   GLUCOSE 92 06/13/2019   NA 138 06/13/2019   K 4.1 06/13/2019   CL 103 06/13/2019   CO2 20 06/13/2019   BUN 14 06/13/2019   CREATININE 0.90 06/13/2019   GFRNONAA 84 06/13/2019   CALCIUM 9.5 06/13/2019   PROT 6.4 06/13/2019   ALBUMIN 4.3 06/13/2019   LABGLOB 2.1 06/13/2019   AGRATIO 2.0 06/13/2019   BILITOT  0.3 06/13/2019   ALKPHOS 51 06/13/2019   AST 19 06/13/2019   ALT 15 06/13/2019   Last thyroid functions Lab Results  Component Value Date   TSH 1.630 08/05/2021   T4TOTAL 5.3 08/05/2021   Last vitamin B12 and Folate Lab Results  Component Value Date   VITAMINB12 293 08/05/2021   FOLATE 9.3 08/05/2021        Assessment & Plan:    Routine Health Maintenance and Physical Exam  Immunization History  Administered Date(s) Administered   Hepatitis A 09/06/2005, 10/08/2005   Hepatitis B 02/24/1996, 05/25/1996, 09/06/2005, 10/08/2005   Influenza Whole 12/21/2018   Influenza,inj,Quad PF,6+ Mos 01/12/2017, 01/10/2018   Influenza-Unspecified 12/19/2014, 12/18/2020, 12/28/2021   PPD Test 09/09/2014   Tdap 09/30/2016, 03/30/2021   Unspecified SARS-COV-2 Vaccination 11/22/2019, 12/13/2019    Health Maintenance  Topic Date Due   COVID-19 Vaccine (3 - 2023-24 season) 11/25/2022 (Originally 11/20/2021)   INFLUENZA VACCINE  06/20/2023 (Originally 10/21/2022)   Hepatitis C Screening  11/09/2023 (Originally 08/06/2002)   PAP SMEAR-Modifier  07/24/2023   DTaP/Tdap/Td (3 - Td or Tdap) 03/31/2031   HIV Screening  Completed   HPV VACCINES  Aged Out    Discussed health benefits of physical activity,  and encouraged her to engage in regular exercise appropriate for her age and condition.  Problem List Items Addressed This Visit       Other            GAD (general anxiety disorder)   Depression, recurrent (HCC) Doing well on below, will continue.    Relevant Medications   buPROPion (WELLBUTRIN XL) 300 MG 24 hr tablet   venlafaxine XR (EFFEXOR-XR) 150 MG 24 hr capsule   Other Visit Diagnoses     Annual physical exam    -  Primary   Relevant Orders   CMP14+EGFR   Anemia Profile B   Thyroid Panel With TSH   Lipid panel   VITAMIN D 25 Hydroxy (Vit-D Deficiency, Fractures)   Screening for deficiency anemia       Relevant Orders   Anemia Profile B   Screening for endocrine disorder        Relevant Orders   CMP14+EGFR   Thyroid Panel With TSH   Screening for lipid disorders       Relevant Orders   Lipid panel   Vitamin D deficiency       Relevant Orders   VITAMIN D 25 Hydroxy (Vit-D Deficiency, Fractures)      The above assessment and management plan was discussed with the patient. The patient verbalized understanding of and has agreed to the management plan. Patient is aware to call the clinic if they develop any new symptoms or if symptoms fail to improve or worsen. Patient is aware when to return to the clinic for a follow-up visit. Patient educated on when it is appropriate to go to the emergency department.   Return in about 1 year (around 11/09/2023) for CPE.   Kari Baars, FNP-C [facility] [address] [place], [place] [address] [phone]

## 2022-11-10 LAB — ANEMIA PROFILE B
Basophils Absolute: 0 10*3/uL (ref 0.0–0.2)
Basos: 1 %
EOS (ABSOLUTE): 0.1 10*3/uL (ref 0.0–0.4)
Eos: 2 %
Ferritin: 30 ng/mL (ref 15–150)
Folate: 9.1 ng/mL (ref >3.0)
Hematocrit: 40.3 % (ref 34.0–46.6)
Hemoglobin: 13.6 g/dL (ref 11.1–15.9)
Immature Grans (Abs): 0 10*3/uL (ref 0.0–0.1)
Immature Granulocytes: 0 %
Iron Saturation: 40 % (ref 15–55)
Iron: 119 ug/dL (ref 27–159)
Lymphocytes Absolute: 1.8 10*3/uL (ref 0.7–3.1)
Lymphs: 40 %
MCH: 31.1 pg (ref 26.6–33.0)
MCHC: 33.7 g/dL (ref 31.5–35.7)
MCV: 92 fL (ref 79–97)
Monocytes Absolute: 0.4 10*3/uL (ref 0.1–0.9)
Monocytes: 9 %
Neutrophils Absolute: 2.1 10*3/uL (ref 1.4–7.0)
Neutrophils: 48 %
Platelets: 245 10*3/uL (ref 150–450)
RBC: 4.38 x10E6/uL (ref 3.77–5.28)
RDW: 12.1 % (ref 11.7–15.4)
Retic Ct Pct: 1.7 % (ref 0.6–2.6)
Total Iron Binding Capacity: 297 ug/dL (ref 250–450)
UIBC: 178 ug/dL (ref 131–425)
Vitamin B-12: 399 pg/mL (ref 232–1245)
WBC: 4.4 10*3/uL (ref 3.4–10.8)

## 2022-11-10 LAB — CMP14+EGFR
ALT: 13 IU/L (ref 0–32)
AST: 16 IU/L (ref 0–40)
Albumin: 4.3 g/dL (ref 3.9–4.9)
Alkaline Phosphatase: 55 IU/L (ref 44–121)
BUN/Creatinine Ratio: 15 (ref 9–23)
BUN: 13 mg/dL (ref 6–20)
Bilirubin Total: 0.3 mg/dL (ref 0.0–1.2)
CO2: 21 mmol/L (ref 20–29)
Calcium: 9.4 mg/dL (ref 8.7–10.2)
Chloride: 105 mmol/L (ref 96–106)
Creatinine, Ser: 0.89 mg/dL (ref 0.57–1.00)
Globulin, Total: 2 g/dL (ref 1.5–4.5)
Glucose: 65 mg/dL — ABNORMAL LOW (ref 70–99)
Potassium: 4.7 mmol/L (ref 3.5–5.2)
Sodium: 138 mmol/L (ref 134–144)
Total Protein: 6.3 g/dL (ref 6.0–8.5)
eGFR: 85 mL/min/{1.73_m2} (ref >59)

## 2022-11-10 LAB — LIPID PANEL
Chol/HDL Ratio: 3.4 ratio (ref 0.0–4.4)
Cholesterol, Total: 179 mg/dL (ref 100–199)
HDL: 53 mg/dL (ref >39)
LDL Chol Calc (NIH): 116 mg/dL — ABNORMAL HIGH (ref 0–99)
Triglycerides: 53 mg/dL (ref 0–149)
VLDL Cholesterol Cal: 10 mg/dL (ref 5–40)

## 2022-11-10 LAB — THYROID PANEL WITH TSH
Free Thyroxine Index: 1.2 (ref 1.2–4.9)
T3 Uptake Ratio: 25 % (ref 24–39)
T4, Total: 4.8 ug/dL (ref 4.5–12.0)
TSH: 0.954 u[IU]/mL (ref 0.450–4.500)

## 2022-11-10 LAB — VITAMIN D 25 HYDROXY (VIT D DEFICIENCY, FRACTURES): Vit D, 25-Hydroxy: 45.1 ng/mL (ref 30.0–100.0)

## 2023-02-17 ENCOUNTER — Other Ambulatory Visit: Payer: Self-pay | Admitting: Medical Genetics

## 2023-02-23 ENCOUNTER — Other Ambulatory Visit: Payer: Self-pay | Admitting: Medical Genetics

## 2023-11-10 ENCOUNTER — Encounter: Payer: 59 | Admitting: Family Medicine

## 2023-11-11 ENCOUNTER — Encounter: Payer: Self-pay | Admitting: Family Medicine

## 2023-11-11 ENCOUNTER — Ambulatory Visit: Admitting: Family Medicine

## 2023-11-11 DIAGNOSIS — F411 Generalized anxiety disorder: Secondary | ICD-10-CM

## 2023-11-11 DIAGNOSIS — F339 Major depressive disorder, recurrent, unspecified: Secondary | ICD-10-CM | POA: Diagnosis not present

## 2023-11-11 MED ORDER — BUPROPION HCL ER (XL) 300 MG PO TB24
300.0000 mg | ORAL_TABLET | Freq: Every day | ORAL | 1 refills | Status: AC
Start: 2023-11-11 — End: ?

## 2023-11-11 MED ORDER — VENLAFAXINE HCL ER 150 MG PO CP24
ORAL_CAPSULE | ORAL | 1 refills | Status: AC
Start: 2023-11-11 — End: ?

## 2023-11-11 NOTE — Progress Notes (Signed)
 Subjective:  Patient ID: Denise Love, female    DOB: 1986-01-20, 38 y.o.   MRN: [id number]  Patient Care Team: Severa Rock HERO, FNP as PCP - General (Family Medicine)   Chief Complaint:  Medical Management of Chronic Issues   HPI: Denise Love is a 38 y.o. female presenting on 11/11/2023 for Medical Management of Chronic Issues   Denise Love is a 38 year old female who presents for medication refills and a physical exam.  She is currently taking Effexor  150 mg and Wellbutrin  300 mg. She manages her symptoms well as long as she adheres to her medication regimen. Missing doses for a couple of days leads to sensations described as 'brain slaps' and headaches, particularly with Wellbutrin .  She has been attempting weight loss and has experienced some success. There are no significant side effects from her medications, and she has not noticed any concerning changes in weight.  She is not pregnant or breastfeeding and has two two-year-olds and a seven-year-old at home, which she describes as 'plenty'.  She has not had laboratory tests since last year, at which time her LDL was minimally elevated, but her HDL and total cholesterol were within normal limits.         11/11/2023   12:17 PM 11/09/2022   10:14 AM 08/05/2021   10:03 AM 12/16/2020    1:39 PM 06/13/2019    8:37 AM  Depression screen PHQ 2/9  Decreased Interest 0 0 0 0 1  Down, Depressed, Hopeless 0 0 0 0 1  PHQ - 2 Score 0 0 0 0 2  Altered sleeping 0 0 0 0 1  Tired, decreased energy 1 0 1 1 1   Change in appetite 0 0 0 0 0  Feeling bad or failure about yourself  0 0 0 0 0  Trouble concentrating 0 0 0 0 0  Moving slowly or fidgety/restless 0 0 0 0 0  Suicidal thoughts 0 0 0 0 0  PHQ-9 Score 1 0 1 1 4   Difficult doing work/chores Not difficult at all Not difficult at all Not difficult at all Somewhat difficult Not difficult at all      11/11/2023   12:18 PM 11/09/2022   10:14 AM 08/05/2021   10:04 AM  12/16/2020    1:40 PM  GAD 7 : Generalized Anxiety Score  Nervous, Anxious, on Edge 1 1 0 0  Control/stop worrying 0 0 0 0  Worry too much - different things 0 0 0 0  Trouble relaxing 0 0 0 0  Restless 0 0 0 0  Easily annoyed or irritable 0 0 0 0  Afraid - awful might happen 0 0 0 0  Total GAD 7 Score 1 1 0 0  Anxiety Difficulty Not difficult at all Not difficult at all  Not difficult at all        Relevant past medical, surgical, family, and social history reviewed and updated as indicated.  Allergies and medications reviewed and updated. Data reviewed: Chart in Epic.   Past Medical History:  Diagnosis Date   Anxiety    GERD (gastroesophageal reflux disease)     Past Surgical History:  Procedure Laterality Date   CESAREAN SECTION MULTI-GESTATIONAL N/A 04/28/2021   Procedure: MULTI-GESTATIONAL CESAREAN SECTION;  Surgeon: Curlene Agent, MD;  Location: [facility];  Service: Obstetrics;  Laterality: N/A;   DILATION AND CURETTAGE, DIAGNOSTIC / THERAPEUTIC      Social History   Socioeconomic History  Marital status: Married    Spouse name: Not on file   Number of children: Not on file   Years of education: Not on file   Highest education level: Bachelor's degree (e.g., BA, AB, BS)  Occupational History   Not on file  Tobacco Use   Smoking status: Former    Current packs/day: 0.25    Types: Cigarettes   Smokeless tobacco: Never  Vaping Use   Vaping status: Never Used  Substance and Sexual Activity   Alcohol use: Not Currently    Alcohol/week: 6.0 standard drinks of alcohol    Types: 6 Cans of beer per week   Drug use: No   Sexual activity: Yes  Other Topics Concern   Not on file  Social History Narrative   Not on file   Social Drivers of Health   Financial Resource Strain: Low Risk  (11/10/2023)   Overall Financial Resource Strain (CARDIA)    Difficulty of Paying Living Expenses: Not hard at all  Food Insecurity: No Food Insecurity (11/10/2023)   Hunger Vital  Sign    Worried About Running Out of Food in the Last Year: Never true    Ran Out of Food in the Last Year: Never true  Transportation Needs: No Transportation Needs (11/10/2023)   PRAPARE - Administrator, Civil Service (Medical): No    Lack of Transportation (Non-Medical): No  Physical Activity: Insufficiently Active (11/10/2023)   Exercise Vital Sign    Days of Exercise per Week: 3 days    Minutes of Exercise per Session: 30 min  Stress: No Stress Concern Present (11/10/2023)   [employer] of Occupational Health - Occupational Stress Questionnaire    Feeling of Stress: Not at all  Social Connections: Socially Integrated (11/10/2023)   Social Connection and Isolation Panel    Frequency of Communication with Friends and Family: More than three times a week    Frequency of Social Gatherings with Friends and Family: More than three times a week    Attends Religious Services: More than 4 times per year    Active Member of [employer] or Organizations: Yes    Attends Banker Meetings: More than 4 times per year    Marital Status: Married  Catering manager Violence: Not on file    Outpatient Encounter Medications as of 11/11/2023  Medication Sig   [DISCONTINUED] buPROPion  (WELLBUTRIN  XL) 300 MG 24 hr tablet Take 1 tablet (300 mg total) by mouth daily.   [DISCONTINUED] venlafaxine  XR (EFFEXOR -XR) 150 MG 24 hr capsule TAKE 1 CAPSULE BY MOUTH DAILY  WITH BREAKFAST   buPROPion  (WELLBUTRIN  XL) 300 MG 24 hr tablet Take 1 tablet (300 mg total) by mouth daily.   venlafaxine  XR (EFFEXOR -XR) 150 MG 24 hr capsule TAKE 1 CAPSULE BY MOUTH DAILY  WITH BREAKFAST   No facility-administered encounter medications on file as of 11/11/2023.    No Known Allergies  Pertinent ROS per HPI, otherwise unremarkable      Objective:  BP 102/66   Pulse 68   Temp 97.7 F (36.5 C)   Ht 5' 6 (1.676 m)   Wt 161 lb 9.6 oz (73.3 kg)   LMP 11/04/2023   SpO2 99%   BMI 26.08 kg/m    Wt  Readings from Last 3 Encounters:  11/11/23 161 lb 9.6 oz (73.3 kg)  11/09/22 178 lb (80.7 kg)  08/05/21 187 lb (84.8 kg)    Physical Exam Vitals and nursing note reviewed.  Constitutional:  General: She is not in acute distress.    Appearance: Normal appearance. She is not ill-appearing, toxic-appearing or diaphoretic.  HENT:     Head: Normocephalic and atraumatic.     Nose: Nose normal.     Mouth/Throat:     Mouth: Mucous membranes are moist.  Eyes:     Pupils: Pupils are equal, round, and reactive to light.  Neck:     Thyroid : No thyroid  mass, thyromegaly or thyroid  tenderness.  Cardiovascular:     Rate and Rhythm: Normal rate and regular rhythm.     Heart sounds: Normal heart sounds.  Pulmonary:     Effort: Pulmonary effort is normal.     Breath sounds: Normal breath sounds.  Musculoskeletal:     Cervical back: Neck supple.  Lymphadenopathy:     Cervical: No cervical adenopathy.  Skin:    General: Skin is warm and dry.     Capillary Refill: Capillary refill takes less than 2 seconds.  Neurological:     General: No focal deficit present.     Mental Status: She is alert and oriented to person, place, and time.  Psychiatric:        Mood and Affect: Mood normal.        Thought Content: Thought content normal.        Judgment: Judgment normal.       Results for orders placed or performed in visit on 11/09/22  [facility] HEPATITIS C SCREENING LAB   Collection Time: 11/11/20 12:00 AM  Result Value Ref Range   HM Hepatitis Screen Negative-Validated   CMP14+EGFR   Collection Time: 11/09/22 10:24 AM  Result Value Ref Range   Glucose 65 (L) 70 - 99 mg/dL   BUN 13 6 - 20 mg/dL   Creatinine, Ser 9.10 0.57 - 1.00 mg/dL   eGFR 85 >40 fO/fpw/8.26   BUN/Creatinine Ratio 15 9 - 23   Sodium 138 134 - 144 mmol/L   Potassium 4.7 3.5 - 5.2 mmol/L   Chloride 105 96 - 106 mmol/L   CO2 21 20 - 29 mmol/L   Calcium  9.4 8.7 - 10.2 mg/dL   Total Protein 6.3 6.0 - 8.5 g/dL   Albumin  4.3 3.9 - 4.9 g/dL   Globulin, Total 2.0 1.5 - 4.5 g/dL   Bilirubin Total 0.3 0.0 - 1.2 mg/dL   Alkaline Phosphatase 55 44 - 121 IU/L   AST 16 0 - 40 IU/L   ALT 13 0 - 32 IU/L  Anemia Profile B   Collection Time: 11/09/22 10:24 AM  Result Value Ref Range   Total Iron  Binding Capacity 297 250 - 450 ug/dL   UIBC 821 868 - 574 ug/dL   Iron  119 27 - 159 ug/dL   Iron  Saturation 40 15 - 55 %   Ferritin 30 15 - 150 ng/mL   Vitamin B-12 399 232 - 1,245 pg/mL   Folate 9.1 >3.0 ng/mL   WBC 4.4 3.4 - 10.8 x10E3/uL   RBC 4.38 3.77 - 5.28 x10E6/uL   Hemoglobin 13.6 11.1 - 15.9 g/dL   Hematocrit 59.6 65.9 - 46.6 %   MCV 92 79 - 97 fL   MCH 31.1 26.6 - 33.0 pg   MCHC 33.7 31.5 - 35.7 g/dL   RDW 87.8 88.2 - 84.5 %   Platelets 245 150 - 450 x10E3/uL   Neutrophils 48 Not Estab. %   Lymphs 40 Not Estab. %   Monocytes 9 Not Estab. %   Eos 2 Not Estab. %  Basos 1 Not Estab. %   Neutrophils Absolute 2.1 1.4 - 7.0 x10E3/uL   Lymphocytes Absolute 1.8 0.7 - 3.1 x10E3/uL   Monocytes Absolute 0.4 0.1 - 0.9 x10E3/uL   EOS (ABSOLUTE) 0.1 0.0 - 0.4 x10E3/uL   Basophils Absolute 0.0 0.0 - 0.2 x10E3/uL   Immature Granulocytes 0 Not Estab. %   Immature Grans (Abs) 0.0 0.0 - 0.1 x10E3/uL   Retic Ct Pct 1.7 0.6 - 2.6 %  Thyroid  Panel With TSH   Collection Time: 11/09/22 10:24 AM  Result Value Ref Range   TSH 0.954 0.450 - 4.500 uIU/mL   T4, Total 4.8 4.5 - 12.0 ug/dL   T3 Uptake Ratio 25 24 - 39 %   Free Thyroxine Index 1.2 1.2 - 4.9  Lipid panel   Collection Time: 11/09/22 10:24 AM  Result Value Ref Range   Cholesterol, Total 179 100 - 199 mg/dL   Triglycerides 53 0 - 149 mg/dL   HDL 53 >60 mg/dL   VLDL Cholesterol Cal 10 5 - 40 mg/dL   LDL Chol Calc (NIH) 883 (H) 0 - 99 mg/dL   Chol/HDL Ratio 3.4 0.0 - 4.4 ratio  VITAMIN D  25 Hydroxy (Vit-D Deficiency, Fractures)   Collection Time: 11/09/22 10:24 AM  Result Value Ref Range   Vit D, 25-Hydroxy 45.1 30.0 - 100.0 ng/mL       Pertinent labs  & imaging results that were available during my care of the patient were reviewed by me and considered in my medical decision making.  Assessment & Plan:  Savera Donson was seen today for medical management of chronic issues.  Diagnoses and all orders for this visit:  GAD (generalized anxiety disorder) -     venlafaxine  XR (EFFEXOR -XR) 150 MG 24 hr capsule; TAKE 1 CAPSULE BY MOUTH DAILY  WITH BREAKFAST -     buPROPion  (WELLBUTRIN  XL) 300 MG 24 hr tablet; Take 1 tablet (300 mg total) by mouth daily.  Depression, recurrent (HCC) -     venlafaxine  XR (EFFEXOR -XR) 150 MG 24 hr capsule; TAKE 1 CAPSULE BY MOUTH DAILY  WITH BREAKFAST -     buPROPion  (WELLBUTRIN  XL) 300 MG 24 hr tablet; Take 1 tablet (300 mg total) by mouth daily.       Depression and Anxiety Depression is managed with Effexor  150 mg and Wellbutrin  300 mg. No significant side effects reported, but missing doses leads to feeling unwell and experiencing 'brain slaps' or headaches. No significant changes in weight noted, although she has been trying to lose weight. - Refill Effexor  150 mg and Wellbutrin  300 mg prescriptions through [facility].  Hyperlipidemia Previous labs showed minimally elevated LDL, but HDL and total cholesterol were within normal limits. No recent labs since last year. - Schedule appointment for physical examination and lab work to update health status.          Continue all other maintenance medications.  Follow up plan: Return for Annual Physical first available .   Continue healthy lifestyle choices, including diet (rich in fruits, vegetables, and lean proteins, and low in salt and simple carbohydrates) and exercise (at least 30 minutes of moderate physical activity daily).   The above assessment and management plan was discussed with the patient. The patient verbalized understanding of and has agreed to the management plan. Patient is aware to call the clinic if they develop any new symptoms or if  symptoms persist or worsen. Patient is aware when to return to the clinic for a follow-up visit. Patient  educated on when it is appropriate to go to the emergency department.   Rosaline Bruns, FNP-C [facility] [phone]

## 2023-11-22 ENCOUNTER — Encounter: Payer: Self-pay | Admitting: Family Medicine

## 2023-11-22 ENCOUNTER — Ambulatory Visit (INDEPENDENT_AMBULATORY_CARE_PROVIDER_SITE_OTHER): Admitting: Family Medicine

## 2023-11-22 VITALS — BP 104/68 | HR 80 | Temp 97.0°F | Ht 66.0 in | Wt 159.4 lb

## 2023-11-22 DIAGNOSIS — Z130 Encounter for screening for diseases of the blood and blood-forming organs and certain disorders involving the immune mechanism: Secondary | ICD-10-CM

## 2023-11-22 DIAGNOSIS — Z0001 Encounter for general adult medical examination with abnormal findings: Secondary | ICD-10-CM | POA: Diagnosis not present

## 2023-11-22 DIAGNOSIS — E559 Vitamin D deficiency, unspecified: Secondary | ICD-10-CM

## 2023-11-22 DIAGNOSIS — Z0000 Encounter for general adult medical examination without abnormal findings: Secondary | ICD-10-CM

## 2023-11-22 DIAGNOSIS — Z136 Encounter for screening for cardiovascular disorders: Secondary | ICD-10-CM

## 2023-11-22 LAB — LIPID PANEL

## 2023-11-22 NOTE — Progress Notes (Signed)
 Complete physical exam  Patient: Denise Love   DOB: 1986/01/19   38 y.o. Female  MRN: [id number]  Subjective:    Chief Complaint  Patient presents with   Annual Exam    Denise Love is a 38 y.o. female who presents today for a complete physical exam. She reports consuming a general diet. The patient does not participate in regular exercise at present. She generally feels well. She reports sleeping well. She does not have additional problems to discuss today.    Most recent fall risk assessment:    11/11/2023   12:17 PM  Fall Risk   Falls in the past year? 0  Risk for fall due to : No Fall Risks  Follow up Falls evaluation completed     Most recent depression screenings:    11/22/2023   10:05 AM 11/11/2023   12:17 PM  PHQ 2/9 Scores  PHQ - 2 Score 0 0  PHQ- 9 Score 1 1    Vision:Within last year and Dental: No current dental problems and Receives regular dental care  Patient Active Problem List   Diagnosis Date Noted   Psoriasis 07/03/2019   Depression, recurrent (HCC) 06/13/2019   GAD (generalized anxiety disorder) 01/25/2014   Past Medical History:  Diagnosis Date   Anxiety    GERD (gastroesophageal reflux disease)    Past Surgical History:  Procedure Laterality Date   CESAREAN SECTION MULTI-GESTATIONAL N/A 04/28/2021   Procedure: MULTI-GESTATIONAL CESAREAN SECTION;  Surgeon: Curlene Agent, MD;  Location: [facility];  Service: Obstetrics;  Laterality: N/A;   DILATION AND CURETTAGE, DIAGNOSTIC / THERAPEUTIC     Social History   Tobacco Use   Smoking status: Former    Current packs/day: 0.25    Types: Cigarettes   Smokeless tobacco: Never  Vaping Use   Vaping status: Never Used  Substance Use Topics   Alcohol use: Not Currently    Alcohol/week: 6.0 standard drinks of alcohol    Types: 6 Cans of beer per week   Drug use: No   Social History   Socioeconomic History   Marital status: Married    Spouse name: Not on file   Number of children: Not on  file   Years of education: Not on file   Highest education level: Bachelor's degree (e.g., BA, AB, BS)  Occupational History   Not on file  Tobacco Use   Smoking status: Former    Current packs/day: 0.25    Types: Cigarettes   Smokeless tobacco: Never  Vaping Use   Vaping status: Never Used  Substance and Sexual Activity   Alcohol use: Not Currently    Alcohol/week: 6.0 standard drinks of alcohol    Types: 6 Cans of beer per week   Drug use: No   Sexual activity: Yes  Other Topics Concern   Not on file  Social History Narrative   Not on file   Social Drivers of Health   Financial Resource Strain: Low Risk  (11/10/2023)   Overall Financial Resource Strain (CARDIA)    Difficulty of Paying Living Expenses: Not hard at all  Food Insecurity: No Food Insecurity (11/10/2023)   Hunger Vital Sign    Worried About Running Out of Food in the Last Year: Never true    Ran Out of Food in the Last Year: Never true  Transportation Needs: No Transportation Needs (11/10/2023)   PRAPARE - Administrator, Civil Service (Medical): No    Lack of Transportation (Non-Medical): No  Physical Activity: Insufficiently Active (11/10/2023)   Exercise Vital Sign    Days of Exercise per Week: 3 days    Minutes of Exercise per Session: 30 min  Stress: No Stress Concern Present (11/10/2023)   [employer] of Occupational Health - Occupational Stress Questionnaire    Feeling of Stress: Not at all  Social Connections: Socially Integrated (11/10/2023)   Social Connection and Isolation Panel    Frequency of Communication with Friends and Family: More than three times a week    Frequency of Social Gatherings with Friends and Family: More than three times a week    Attends Religious Services: More than 4 times per year    Active Member of [employer] or Organizations: Yes    Attends Engineer, structural: More than 4 times per year    Marital Status: Married  Catering manager Violence: Not on  file   Family Status  Relation Name Status   Mother  Alive   Father  Alive   Brother  Alive   MGM  Deceased   MGF  Deceased   PGM  Deceased   PGF  Deceased  No partnership data on file   Family History  Problem Relation Age of Onset   Cancer Maternal Grandmother    No Known Allergies    Patient Care Team: Severa Rock HERO, FNP as PCP - General (Family Medicine)   Outpatient Medications Prior to Visit  Medication Sig   buPROPion  (WELLBUTRIN  XL) 300 MG 24 hr tablet Take 1 tablet (300 mg total) by mouth daily.   venlafaxine  XR (EFFEXOR -XR) 150 MG 24 hr capsule TAKE 1 CAPSULE BY MOUTH DAILY  WITH BREAKFAST   No facility-administered medications prior to visit.   ROS per HPI      Objective:     BP 104/68   Pulse 80   Temp (!) 97 F (36.1 C)   Ht 5' 6 (1.676 m)   Wt 159 lb 6.4 oz (72.3 kg)   LMP 11/04/2023   SpO2 98%   BMI 25.73 kg/m  BP Readings from Last 3 Encounters:  11/22/23 104/68  11/11/23 102/66  11/09/22 101/66   Wt Readings from Last 3 Encounters:  11/22/23 159 lb 6.4 oz (72.3 kg)  11/11/23 161 lb 9.6 oz (73.3 kg)  11/09/22 178 lb (80.7 kg)      Physical Exam Vitals and nursing note reviewed.  Constitutional:      General: She is not in acute distress.    Appearance: Normal appearance. She is well-developed, well-groomed and overweight. She is not ill-appearing, toxic-appearing or diaphoretic.  HENT:     Head: Normocephalic and atraumatic.     Jaw: There is normal jaw occlusion.     Right Ear: Hearing, tympanic membrane, ear canal and external ear normal.     Left Ear: Hearing, tympanic membrane, ear canal and external ear normal.     Nose: Nose normal.     Mouth/Throat:     Lips: Pink.     Mouth: Mucous membranes are moist.     Pharynx: Oropharynx is clear. Uvula midline.  Eyes:     General: Lids are normal.     Extraocular Movements: Extraocular movements intact.     Conjunctiva/sclera: Conjunctivae normal.     Pupils: Pupils are  equal, round, and reactive to light.  Neck:     Thyroid : No thyroid  mass, thyromegaly or thyroid  tenderness.     Vascular: No carotid bruit or JVD.     Trachea:  Trachea and phonation normal.  Cardiovascular:     Rate and Rhythm: Normal rate and regular rhythm.     Chest Wall: PMI is not displaced.     Pulses: Normal pulses.     Heart sounds: Normal heart sounds. No murmur heard.    No friction rub. No gallop.  Pulmonary:     Effort: Pulmonary effort is normal. No respiratory distress.     Breath sounds: Normal breath sounds. No wheezing.  Abdominal:     General: Bowel sounds are normal. There is no distension or abdominal bruit.     Palpations: Abdomen is soft. There is no hepatomegaly or splenomegaly.     Tenderness: There is no abdominal tenderness. There is no right CVA tenderness or left CVA tenderness.     Hernia: No hernia is present.  Genitourinary:    Comments: Sees GYN Musculoskeletal:        General: Normal range of motion.     Cervical back: Normal range of motion and neck supple.     Right lower leg: No edema.     Left lower leg: No edema.  Lymphadenopathy:     Cervical: No cervical adenopathy.  Skin:    General: Skin is warm and dry.     Capillary Refill: Capillary refill takes less than 2 seconds.     Coloration: Skin is not cyanotic, jaundiced or pale.     Findings: No rash.  Neurological:     General: No focal deficit present.     Mental Status: She is alert and oriented to person, place, and time.     Sensory: Sensation is intact.     Motor: Motor function is intact.     Coordination: Coordination is intact.     Gait: Gait is intact.     Deep Tendon Reflexes: Reflexes are normal and symmetric.  Psychiatric:        Attention and Perception: Attention and perception normal.        Mood and Affect: Mood and affect normal.        Speech: Speech normal.        Behavior: Behavior normal. Behavior is cooperative.        Thought Content: Thought content normal.         Cognition and Memory: Cognition and memory normal.        Judgment: Judgment normal.       Last CBC Lab Results  Component Value Date   WBC 4.4 11/09/2022   HGB 13.6 11/09/2022   HCT 40.3 11/09/2022   MCV 92 11/09/2022   MCH 31.1 11/09/2022   RDW 12.1 11/09/2022   PLT 245 11/09/2022   Last metabolic panel Lab Results  Component Value Date   GLUCOSE 65 (L) 11/09/2022   NA 138 11/09/2022   K 4.7 11/09/2022   CL 105 11/09/2022   CO2 21 11/09/2022   BUN 13 11/09/2022   CREATININE 0.89 11/09/2022   EGFR 85 11/09/2022   CALCIUM  9.4 11/09/2022   PROT 6.3 11/09/2022   ALBUMIN 4.3 11/09/2022   LABGLOB 2.0 11/09/2022   AGRATIO 2.0 06/13/2019   BILITOT 0.3 11/09/2022   ALKPHOS 55 11/09/2022   AST 16 11/09/2022   ALT 13 11/09/2022   Last lipids Lab Results  Component Value Date   CHOL 179 11/09/2022   HDL 53 11/09/2022   LDLCALC 116 (H) 11/09/2022   TRIG 53 11/09/2022   CHOLHDL 3.4 11/09/2022    Last thyroid  functions Lab Results  Component  Value Date   TSH 0.954 11/09/2022   T4TOTAL 4.8 11/09/2022   Last vitamin D  Lab Results  Component Value Date   VD25OH 45.1 11/09/2022   Last vitamin B12 and Folate Lab Results  Component Value Date   VITAMINB12 399 11/09/2022   FOLATE 9.1 11/09/2022        Assessment & Plan:    Routine Health Maintenance and Physical Exam  Immunization History  Administered Date(s) Administered   Hepatitis A 09/06/2005, 10/08/2005   Hepatitis B 02/24/1996, 05/25/1996, 09/06/2005, 10/08/2005   Influenza Whole 12/21/2018   Influenza,inj,Quad PF,6+ Mos 01/12/2017, 01/10/2018   Influenza-Unspecified 12/19/2014, 12/18/2020, 12/28/2021   PPD Test 09/09/2014   Tdap 07/22/2016, 09/30/2016, 03/30/2021   Unspecified SARS-COV-2 Vaccination 11/22/2019, 12/13/2019    Health Maintenance  Topic Date Due   Cervical Cancer Screening (HPV/Pap Cotest)  Never done   COVID-19 Vaccine (3 - 2025-26 season) 12/08/2023 (Originally 11/21/2023)    INFLUENZA VACCINE  06/19/2024 (Originally 10/21/2023)   HPV VACCINES (1 - 3-dose SCDM series) 11/21/2024 (Originally 08/06/2011)   DTaP/Tdap/Td (4 - Td or Tdap) 03/31/2031   Hepatitis B Vaccines 19-59 Average Risk  Completed   Hepatitis C Screening  Completed   HIV Screening  Completed   Pneumococcal Vaccine  Aged Out   Meningococcal B Vaccine  Aged Out    Discussed health benefits of physical activity, and encouraged her to engage in regular exercise appropriate for her age and condition.  Problem List Items Addressed This Visit   None Visit Diagnoses       Annual physical exam    -  Primary   Relevant Orders   CBC with Differential/Platelet   CMP14+EGFR   Lipid panel     Vitamin D  deficiency       Relevant Orders   CMP14+EGFR   VITAMIN D  25 Hydroxy (Vit-D Deficiency, Fractures)     Encounter for screening for cardiovascular disorders       Relevant Orders   CBC with Differential/Platelet   CMP14+EGFR   Lipid panel     Screening for endocrine, nutritional, metabolic and immunity disorder       Relevant Orders   CBC with Differential/Platelet   CMP14+EGFR   Thyroid  Panel With TSH   Lipid panel   VITAMIN D  25 Hydroxy (Vit-D Deficiency, Fractures)      Return in about 1 year (around 11/21/2024) for Annual Physical.     Rosaline Bruns, FNP

## 2023-11-23 LAB — LIPID PANEL
Cholesterol, Total: 196 mg/dL (ref 100–199)
HDL: 55 mg/dL (ref >39)
LDL CALC COMMENT:: 3.6 ratio (ref 0.0–4.4)
LDL Chol Calc (NIH): 132 mg/dL — AB (ref 0–99)
Triglycerides: 51 mg/dL (ref 0–149)
VLDL Cholesterol Cal: 9 mg/dL (ref 5–40)

## 2023-11-23 LAB — CMP14+EGFR
ALT: 9 IU/L (ref 0–32)
AST: 14 IU/L (ref 0–40)
Albumin: 4.5 g/dL (ref 3.9–4.9)
Alkaline Phosphatase: 44 IU/L (ref 44–121)
BUN/Creatinine Ratio: 15 (ref 9–23)
BUN: 13 mg/dL (ref 6–20)
Bilirubin Total: 0.4 mg/dL (ref 0.0–1.2)
CO2: 20 mmol/L (ref 20–29)
Calcium: 9.5 mg/dL (ref 8.7–10.2)
Chloride: 102 mmol/L (ref 96–106)
Creatinine, Ser: 0.87 mg/dL (ref 0.57–1.00)
Globulin, Total: 2 g/dL (ref 1.5–4.5)
Glucose: 80 mg/dL (ref 70–99)
Potassium: 4.5 mmol/L (ref 3.5–5.2)
Sodium: 137 mmol/L (ref 134–144)
Total Protein: 6.5 g/dL (ref 6.0–8.5)
eGFR: 87 mL/min/1.73 (ref >59)

## 2023-11-23 LAB — CBC WITH DIFFERENTIAL/PLATELET
Basophils Absolute: 0 x10E3/uL (ref 0.0–0.2)
Basos: 1 %
EOS (ABSOLUTE): 0.2 x10E3/uL (ref 0.0–0.4)
Eos: 3 %
Hematocrit: 41.7 % (ref 34.0–46.6)
Hemoglobin: 13.7 g/dL (ref 11.1–15.9)
Immature Grans (Abs): 0 x10E3/uL (ref 0.0–0.1)
Immature Granulocytes: 0 %
Lymphocytes Absolute: 2 x10E3/uL (ref 0.7–3.1)
Lymphs: 34 %
MCH: 31.1 pg (ref 26.6–33.0)
MCHC: 32.9 g/dL (ref 31.5–35.7)
MCV: 95 fL (ref 79–97)
Monocytes Absolute: 0.4 x10E3/uL (ref 0.1–0.9)
Monocytes: 7 %
Neutrophils Absolute: 3.4 x10E3/uL (ref 1.4–7.0)
Neutrophils: 55 %
Platelets: 276 x10E3/uL (ref 150–450)
RBC: 4.41 x10E6/uL (ref 3.77–5.28)
RDW: 12.3 % (ref 11.7–15.4)
WBC: 6.1 x10E3/uL (ref 3.4–10.8)

## 2023-11-23 LAB — THYROID PANEL WITH TSH
Free Thyroxine Index: 1.4 (ref 1.2–4.9)
T3 Uptake Ratio: 26 (ref 24–39)
T4, Total: 5.5 ug/dL (ref 4.5–12.0)
TSH: 1.09 u[IU]/mL (ref 0.450–4.500)

## 2023-11-23 LAB — VITAMIN D 25 HYDROXY (VIT D DEFICIENCY, FRACTURES): Vit D, 25-Hydroxy: 41.1 ng/mL (ref 30.0–100.0)

## 2023-11-27 ENCOUNTER — Ambulatory Visit: Payer: Self-pay | Admitting: Family Medicine

## 2023-12-01 ENCOUNTER — Encounter: Payer: Self-pay | Admitting: Obstetrics & Gynecology

## 2024-01-04 ENCOUNTER — Other Ambulatory Visit: Payer: Self-pay | Admitting: Medical Genetics

## 2024-01-04 DIAGNOSIS — Z006 Encounter for examination for normal comparison and control in clinical research program: Secondary | ICD-10-CM

## 2024-02-07 LAB — GENECONNECT MOLECULAR SCREEN: Genetic Analysis Overall Interpretation: NEGATIVE

## 2024-02-26 ENCOUNTER — Encounter: Payer: Self-pay | Admitting: Family Medicine

## 2024-02-26 DIAGNOSIS — R3989 Other symptoms and signs involving the genitourinary system: Secondary | ICD-10-CM

## 2024-02-27 MED ORDER — NITROFURANTOIN MONOHYD MACRO 100 MG PO CAPS: 100.0000 mg | ORAL_CAPSULE | Freq: Two times a day (BID) | ORAL | 0 refills | Status: AC
